# Patient Record
Sex: Female | Born: 1985 | Race: Black or African American | Hispanic: No | Smoking: Never smoker
Health system: Southern US, Community
[De-identification: ages and names within clinical notes are randomized; demographics above are authoritative.]

## PROBLEM LIST (undated history)

## (undated) DIAGNOSIS — M419 Scoliosis, unspecified: Secondary | ICD-10-CM

## (undated) DIAGNOSIS — G8929 Other chronic pain: Secondary | ICD-10-CM

## (undated) DIAGNOSIS — F32A Depression, unspecified: Secondary | ICD-10-CM

## (undated) DIAGNOSIS — F99 Mental disorder, not otherwise specified: Secondary | ICD-10-CM

## (undated) DIAGNOSIS — F419 Anxiety disorder, unspecified: Secondary | ICD-10-CM

## (undated) DIAGNOSIS — M549 Dorsalgia, unspecified: Secondary | ICD-10-CM

## (undated) DIAGNOSIS — F329 Major depressive disorder, single episode, unspecified: Secondary | ICD-10-CM

## (undated) HISTORY — DX: Depression, unspecified: F32.A

## (undated) HISTORY — DX: Major depressive disorder, single episode, unspecified: F32.9

## (undated) HISTORY — PX: NO PAST SURGERIES: SHX2092

---
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
## 2007-02-17 ENCOUNTER — Encounter: Payer: Self-pay | Admitting: *Deleted

## 2007-04-08 ENCOUNTER — Emergency Department (HOSPITAL_COMMUNITY): Admission: EM | Admit: 2007-04-08 | Discharge: 2007-04-08 | Payer: Self-pay | Admitting: Emergency Medicine

## 2007-11-22 ENCOUNTER — Emergency Department (HOSPITAL_COMMUNITY): Admission: EM | Admit: 2007-11-22 | Discharge: 2007-11-22 | Payer: Self-pay | Admitting: Family Medicine

## 2007-12-27 ENCOUNTER — Emergency Department (HOSPITAL_COMMUNITY): Admission: EM | Admit: 2007-12-27 | Discharge: 2007-12-28 | Payer: Self-pay | Admitting: Emergency Medicine

## 2008-05-05 ENCOUNTER — Emergency Department (HOSPITAL_COMMUNITY): Admission: EM | Admit: 2008-05-05 | Discharge: 2008-05-05 | Payer: Self-pay | Admitting: Emergency Medicine

## 2008-05-17 ENCOUNTER — Encounter: Admission: RE | Admit: 2008-05-17 | Discharge: 2008-06-21 | Payer: Self-pay | Admitting: Chiropractic Medicine

## 2008-07-04 ENCOUNTER — Emergency Department (HOSPITAL_COMMUNITY): Admission: EM | Admit: 2008-07-04 | Discharge: 2008-07-04 | Payer: Self-pay | Admitting: Emergency Medicine

## 2008-11-07 ENCOUNTER — Emergency Department (HOSPITAL_COMMUNITY): Admission: EM | Admit: 2008-11-07 | Discharge: 2008-11-08 | Payer: Self-pay | Admitting: Emergency Medicine

## 2008-12-04 ENCOUNTER — Emergency Department (HOSPITAL_COMMUNITY): Admission: EM | Admit: 2008-12-04 | Discharge: 2008-12-04 | Payer: Self-pay | Admitting: Emergency Medicine

## 2008-12-06 ENCOUNTER — Emergency Department (HOSPITAL_COMMUNITY): Admission: EM | Admit: 2008-12-06 | Discharge: 2008-12-06 | Payer: Self-pay | Admitting: Emergency Medicine

## 2009-01-07 ENCOUNTER — Emergency Department (HOSPITAL_COMMUNITY): Admission: EM | Admit: 2009-01-07 | Discharge: 2009-01-08 | Payer: Self-pay | Admitting: Emergency Medicine

## 2009-01-08 ENCOUNTER — Ambulatory Visit: Payer: Self-pay | Admitting: *Deleted

## 2009-01-08 ENCOUNTER — Inpatient Hospital Stay (HOSPITAL_COMMUNITY): Admission: AD | Admit: 2009-01-08 | Discharge: 2009-01-15 | Payer: Self-pay | Admitting: *Deleted

## 2009-03-08 ENCOUNTER — Emergency Department (HOSPITAL_COMMUNITY): Admission: EM | Admit: 2009-03-08 | Discharge: 2009-03-09 | Payer: Self-pay | Admitting: Emergency Medicine

## 2009-03-09 ENCOUNTER — Ambulatory Visit: Payer: Self-pay | Admitting: *Deleted

## 2009-03-09 ENCOUNTER — Inpatient Hospital Stay (HOSPITAL_COMMUNITY): Admission: RE | Admit: 2009-03-09 | Discharge: 2009-03-14 | Payer: Self-pay | Admitting: *Deleted

## 2009-06-11 ENCOUNTER — Emergency Department (HOSPITAL_COMMUNITY): Admission: EM | Admit: 2009-06-11 | Discharge: 2009-06-11 | Payer: Self-pay | Admitting: Emergency Medicine

## 2009-06-13 ENCOUNTER — Inpatient Hospital Stay (HOSPITAL_COMMUNITY): Admission: EM | Admit: 2009-06-13 | Discharge: 2009-06-17 | Payer: Self-pay | Admitting: Emergency Medicine

## 2009-08-14 ENCOUNTER — Emergency Department (HOSPITAL_COMMUNITY): Admission: EM | Admit: 2009-08-14 | Discharge: 2009-08-21 | Payer: Self-pay | Admitting: Emergency Medicine

## 2009-11-21 ENCOUNTER — Inpatient Hospital Stay (HOSPITAL_COMMUNITY): Admission: AD | Admit: 2009-11-21 | Discharge: 2009-11-21 | Payer: Self-pay | Admitting: Obstetrics & Gynecology

## 2010-10-01 LAB — WET PREP, GENITAL: Trich, Wet Prep: NONE SEEN

## 2010-10-01 LAB — HERPES SIMPLEX VIRUS CULTURE: Culture: NOT DETECTED

## 2010-10-01 LAB — GC/CHLAMYDIA PROBE AMP, GENITAL
Chlamydia, DNA Probe: NEGATIVE
GC Probe Amp, Genital: NEGATIVE

## 2010-10-02 LAB — RAPID URINE DRUG SCREEN, HOSP PERFORMED
Amphetamines: NOT DETECTED
Benzodiazepines: NOT DETECTED
Cocaine: NOT DETECTED

## 2010-10-02 LAB — URINALYSIS, ROUTINE W REFLEX MICROSCOPIC
Bilirubin Urine: NEGATIVE
Glucose, UA: NEGATIVE mg/dL
Hgb urine dipstick: NEGATIVE
Ketones, ur: NEGATIVE mg/dL
Nitrite: NEGATIVE
Protein, ur: NEGATIVE mg/dL
pH: 6 (ref 5.0–8.0)

## 2010-10-02 LAB — COMPREHENSIVE METABOLIC PANEL
ALT: 14 U/L (ref 0–35)
AST: 17 U/L (ref 0–37)
Albumin: 3.9 g/dL (ref 3.5–5.2)
Alkaline Phosphatase: 27 U/L — ABNORMAL LOW (ref 39–117)
CO2: 27 mEq/L (ref 19–32)
Chloride: 105 mEq/L (ref 96–112)
Creatinine, Ser: 0.66 mg/dL (ref 0.4–1.2)
GFR calc Af Amer: >60 mL/min (ref >60)
Glucose, Bld: 95 mg/dL (ref 70–99)
Total Bilirubin: 0.6 mg/dL (ref 0.3–1.2)

## 2010-10-02 LAB — CBC
Platelets: 201 10*3/uL (ref 150–400)
WBC: 6.4 10*3/uL (ref 4.0–10.5)

## 2010-10-02 LAB — DIFFERENTIAL
Basophils Absolute: 0 10*3/uL (ref 0.0–0.1)
Eosinophils Absolute: 0.1 10*3/uL (ref 0.0–0.7)
Eosinophils Relative: 1 % (ref 0–5)
Lymphocytes Relative: 52 % — ABNORMAL HIGH (ref 12–46)
Monocytes Absolute: 0.3 10*3/uL (ref 0.1–1.0)
Monocytes Relative: 4 % (ref 3–12)
Neutro Abs: 2.7 10*3/uL (ref 1.7–7.7)

## 2010-10-02 LAB — ETHANOL: Alcohol, Ethyl (B): 6 mg/dL (ref 0–10)

## 2010-10-15 LAB — URINE MICROSCOPIC-ADD ON

## 2010-10-15 LAB — COMPREHENSIVE METABOLIC PANEL
Albumin: 3 g/dL — ABNORMAL LOW (ref 3.5–5.2)
Albumin: 3.6 g/dL (ref 3.5–5.2)
Alkaline Phosphatase: 23 U/L — ABNORMAL LOW (ref 39–117)
BUN: 1 mg/dL — ABNORMAL LOW (ref 6–23)
CO2: 27 mEq/L (ref 19–32)
Calcium: 8.1 mg/dL — ABNORMAL LOW (ref 8.4–10.5)
Calcium: 8.5 mg/dL (ref 8.4–10.5)
Chloride: 106 mEq/L (ref 96–112)
Creatinine, Ser: 0.68 mg/dL (ref 0.4–1.2)
Creatinine, Ser: 0.68 mg/dL (ref 0.4–1.2)
GFR calc Af Amer: >60 mL/min (ref >60)
Glucose, Bld: 116 mg/dL — ABNORMAL HIGH (ref 70–99)
Potassium: 3.6 mEq/L (ref 3.5–5.1)
Sodium: 139 mEq/L (ref 135–145)
Total Protein: 5.2 g/dL — ABNORMAL LOW (ref 6.0–8.3)
Total Protein: 6.2 g/dL (ref 6.0–8.3)

## 2010-10-15 LAB — URINALYSIS, ROUTINE W REFLEX MICROSCOPIC
Ketones, ur: NEGATIVE mg/dL
Nitrite: NEGATIVE
Protein, ur: NEGATIVE mg/dL

## 2010-10-15 LAB — DIFFERENTIAL
Basophils Absolute: 0 10*3/uL (ref 0.0–0.1)
Basophils Relative: 0 % (ref 0–1)
Eosinophils Absolute: 0 10*3/uL (ref 0.0–0.7)
Lymphocytes Relative: 50 % — ABNORMAL HIGH (ref 12–46)
Monocytes Absolute: 0.3 10*3/uL (ref 0.1–1.0)
Neutrophils Relative %: 45 % (ref 43–77)

## 2010-10-15 LAB — CBC
HCT: 28.7 % — ABNORMAL LOW (ref 36.0–46.0)
HCT: 31.8 % — ABNORMAL LOW (ref 36.0–46.0)
MCV: 79.6 fL (ref 78.0–100.0)
Platelets: 197 10*3/uL (ref 150–400)
Platelets: 224 10*3/uL (ref 150–400)
RDW: 16.2 % — ABNORMAL HIGH (ref 11.5–15.5)

## 2010-10-15 LAB — URINE CULTURE
Colony Count: NO GROWTH
Culture: NO GROWTH

## 2010-10-15 LAB — POCT PREGNANCY, URINE: Preg Test, Ur: NEGATIVE

## 2010-10-16 LAB — URINALYSIS, ROUTINE W REFLEX MICROSCOPIC
Ketones, ur: NEGATIVE mg/dL
Nitrite: NEGATIVE
Protein, ur: NEGATIVE mg/dL
pH: 7.5 (ref 5.0–8.0)

## 2010-10-16 LAB — POCT PREGNANCY, URINE: Preg Test, Ur: NEGATIVE

## 2010-10-16 LAB — DIFFERENTIAL
Basophils Relative: 0 % (ref 0–1)
Eosinophils Absolute: 0 10*3/uL (ref 0.0–0.7)
Lymphs Abs: 3.4 10*3/uL (ref 0.7–4.0)
Neutrophils Relative %: 38 % — ABNORMAL LOW (ref 43–77)

## 2010-10-16 LAB — COMPREHENSIVE METABOLIC PANEL
ALT: 11 U/L (ref 0–35)
CO2: 27 mEq/L (ref 19–32)
Calcium: 7.9 mg/dL — ABNORMAL LOW (ref 8.4–10.5)
Creatinine, Ser: 0.8 mg/dL (ref 0.4–1.2)
GFR calc non Af Amer: >60 mL/min (ref >60)
Glucose, Bld: 78 mg/dL (ref 70–99)
Sodium: 139 mEq/L (ref 135–145)

## 2010-10-16 LAB — CBC
HCT: 28.2 % — ABNORMAL LOW (ref 36.0–46.0)
Hemoglobin: 9.6 g/dL — ABNORMAL LOW (ref 12.0–15.0)
MCHC: 34.1 g/dL (ref 30.0–36.0)
MCV: 79.4 fL (ref 78.0–100.0)
RBC: 3.56 MIL/uL — ABNORMAL LOW (ref 3.87–5.11)

## 2010-10-19 LAB — BASIC METABOLIC PANEL
CO2: 26 mEq/L (ref 19–32)
Calcium: 9 mg/dL (ref 8.4–10.5)
Chloride: 104 mEq/L (ref 96–112)
GFR calc Af Amer: >60 mL/min (ref >60)
Sodium: 138 mEq/L (ref 135–145)

## 2010-10-19 LAB — URINE MICROSCOPIC-ADD ON

## 2010-10-19 LAB — DIFFERENTIAL
Basophils Relative: 1 % (ref 0–1)
Lymphs Abs: 1.8 10*3/uL (ref 0.7–4.0)
Monocytes Absolute: 0.2 10*3/uL (ref 0.1–1.0)
Monocytes Relative: 4 % (ref 3–12)
Neutro Abs: 3.9 10*3/uL (ref 1.7–7.7)

## 2010-10-19 LAB — CBC
Hemoglobin: 11.2 g/dL — ABNORMAL LOW (ref 12.0–15.0)
MCHC: 33.4 g/dL (ref 30.0–36.0)
MCV: 78.2 fL (ref 78.0–100.0)
RBC: 4.29 MIL/uL (ref 3.87–5.11)
WBC: 5.9 10*3/uL (ref 4.0–10.5)

## 2010-10-19 LAB — URINALYSIS, ROUTINE W REFLEX MICROSCOPIC
Bilirubin Urine: NEGATIVE
Glucose, UA: NEGATIVE mg/dL
Hgb urine dipstick: NEGATIVE
Nitrite: NEGATIVE
Protein, ur: NEGATIVE mg/dL
Specific Gravity, Urine: 1.02 (ref 1.005–1.030)
Specific Gravity, Urine: 1.029 (ref 1.005–1.030)
Urobilinogen, UA: 0.2 mg/dL (ref 0.0–1.0)
pH: 6 (ref 5.0–8.0)

## 2010-10-19 LAB — GC/CHLAMYDIA PROBE AMP, URINE: Chlamydia, Swab/Urine, PCR: NEGATIVE

## 2010-10-19 LAB — RAPID URINE DRUG SCREEN, HOSP PERFORMED: Barbiturates: NOT DETECTED

## 2010-10-21 LAB — URINE MICROSCOPIC-ADD ON

## 2010-10-21 LAB — DIFFERENTIAL
Basophils Absolute: 0 10*3/uL (ref 0.0–0.1)
Lymphocytes Relative: 33 % (ref 12–46)
Lymphs Abs: 2.5 10*3/uL (ref 0.7–4.0)
Monocytes Absolute: 0.4 10*3/uL (ref 0.1–1.0)
Neutro Abs: 4.6 10*3/uL (ref 1.7–7.7)

## 2010-10-21 LAB — URINALYSIS, ROUTINE W REFLEX MICROSCOPIC
Glucose, UA: NEGATIVE mg/dL
Nitrite: NEGATIVE
Specific Gravity, Urine: 1.026 (ref 1.005–1.030)
pH: 5.5 (ref 5.0–8.0)

## 2010-10-21 LAB — CBC
HCT: 33.6 % — ABNORMAL LOW (ref 36.0–46.0)
Hemoglobin: 11.1 g/dL — ABNORMAL LOW (ref 12.0–15.0)
RDW: 15.6 % — ABNORMAL HIGH (ref 11.5–15.5)
WBC: 7.6 10*3/uL (ref 4.0–10.5)

## 2010-10-21 LAB — BASIC METABOLIC PANEL
Calcium: 9.2 mg/dL (ref 8.4–10.5)
GFR calc Af Amer: >60 mL/min (ref >60)
GFR calc non Af Amer: >60 mL/min (ref >60)
Glucose, Bld: 97 mg/dL (ref 70–99)
Potassium: 3.5 mEq/L (ref 3.5–5.1)
Sodium: 138 mEq/L (ref 135–145)

## 2010-10-21 LAB — ACETAMINOPHEN LEVEL: Acetaminophen (Tylenol), Serum: <10 ug/mL — ABNORMAL LOW (ref 10–30)

## 2010-10-21 LAB — ETHANOL: Alcohol, Ethyl (B): <5 mg/dL (ref 0–10)

## 2010-10-22 LAB — URINE MICROSCOPIC-ADD ON

## 2010-10-22 LAB — URINALYSIS, ROUTINE W REFLEX MICROSCOPIC
Bilirubin Urine: NEGATIVE
Glucose, UA: NEGATIVE mg/dL
Hgb urine dipstick: NEGATIVE
Ketones, ur: NEGATIVE mg/dL
Nitrite: NEGATIVE
Specific Gravity, Urine: 1.03 (ref 1.005–1.030)
pH: 6 (ref 5.0–8.0)
pH: 6 (ref 5.0–8.0)

## 2010-10-22 LAB — CBC
HCT: 33.6 % — ABNORMAL LOW (ref 36.0–46.0)
MCHC: 33.1 g/dL (ref 30.0–36.0)
MCV: 77.2 fL — ABNORMAL LOW (ref 78.0–100.0)
MCV: 77.6 fL — ABNORMAL LOW (ref 78.0–100.0)
Platelets: 255 10*3/uL (ref 150–400)
RBC: 4.42 MIL/uL (ref 3.87–5.11)
RDW: 15.6 % — ABNORMAL HIGH (ref 11.5–15.5)

## 2010-10-22 LAB — WET PREP, GENITAL: Trich, Wet Prep: NONE SEEN

## 2010-10-22 LAB — DIFFERENTIAL
Basophils Absolute: 0 10*3/uL (ref 0.0–0.1)
Basophils Relative: 0 % (ref 0–1)
Eosinophils Absolute: 0 10*3/uL (ref 0.0–0.7)
Eosinophils Relative: 1 % (ref 0–5)
Lymphocytes Relative: 36 % (ref 12–46)
Lymphs Abs: 2.4 10*3/uL (ref 0.7–4.0)
Neutro Abs: 3.7 10*3/uL (ref 1.7–7.7)
Neutrophils Relative %: 55 % (ref 43–77)

## 2010-10-22 LAB — COMPREHENSIVE METABOLIC PANEL
AST: 20 U/L (ref 0–37)
CO2: 26 mEq/L (ref 19–32)
Calcium: 8.8 mg/dL (ref 8.4–10.5)
Creatinine, Ser: 0.75 mg/dL (ref 0.4–1.2)
GFR calc Af Amer: >60 mL/min (ref >60)
GFR calc non Af Amer: >60 mL/min (ref >60)
Glucose, Bld: 71 mg/dL (ref 70–99)

## 2010-10-22 LAB — GC/CHLAMYDIA PROBE AMP, GENITAL: GC Probe Amp, Genital: NEGATIVE

## 2010-10-22 LAB — LIPASE, BLOOD: Lipase: 26 U/L (ref 11–59)
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
## 2011-01-28 ENCOUNTER — Inpatient Hospital Stay (INDEPENDENT_AMBULATORY_CARE_PROVIDER_SITE_OTHER)
Admission: RE | Admit: 2011-01-28 | Discharge: 2011-01-28 | Disposition: A | Discharge status: Home / Self Care | Payer: Medicaid Other | Source: Ambulatory Visit | Attending: Family Medicine | Admitting: Family Medicine

## 2011-01-28 DIAGNOSIS — L259 Unspecified contact dermatitis, unspecified cause: Secondary | ICD-10-CM

## 2011-04-10 LAB — DIFFERENTIAL
Eosinophils Absolute: 0
Eosinophils Relative: 0
Lymphs Abs: 2.8
Monocytes Absolute: 0.3
Monocytes Relative: 4

## 2011-04-10 LAB — URINALYSIS, ROUTINE W REFLEX MICROSCOPIC
Bilirubin Urine: NEGATIVE
Glucose, UA: NEGATIVE
Hgb urine dipstick: NEGATIVE
Protein, ur: NEGATIVE
Urobilinogen, UA: 1

## 2011-04-10 LAB — CBC
HCT: 34.8 — ABNORMAL LOW
Hemoglobin: 11.6 — ABNORMAL LOW
MCHC: 33.3
MCV: 76.9 — ABNORMAL LOW
Platelets: 215
RBC: 4.52
RDW: 15.2
WBC: 6.8

## 2011-04-10 LAB — POCT I-STAT, CHEM 8
BUN: 12
Calcium, Ion: 1.23
Chloride: 105
Creatinine, Ser: 0.9
Glucose, Bld: 89
HCT: 37
Hemoglobin: 12.6
Potassium: 4.1
Sodium: 138
TCO2: 25

## 2011-04-10 LAB — URINE MICROSCOPIC-ADD ON

## 2011-04-10 LAB — POCT PREGNANCY, URINE
Operator id: 29011
Preg Test, Ur: NEGATIVE

## 2011-04-18 LAB — URINALYSIS, ROUTINE W REFLEX MICROSCOPIC
Bilirubin Urine: NEGATIVE
Glucose, UA: NEGATIVE mg/dL
Hgb urine dipstick: NEGATIVE
Ketones, ur: NEGATIVE mg/dL
pH: 8 (ref 5.0–8.0)

## 2011-04-18 LAB — COMPREHENSIVE METABOLIC PANEL
AST: 21 U/L (ref 0–37)
Albumin: 3.7 g/dL (ref 3.5–5.2)
Alkaline Phosphatase: 30 U/L — ABNORMAL LOW (ref 39–117)
BUN: 7 mg/dL (ref 6–23)
Chloride: 106 mEq/L (ref 96–112)
Potassium: 4 mEq/L (ref 3.5–5.1)
Total Bilirubin: 0.6 mg/dL (ref 0.3–1.2)

## 2011-04-18 LAB — CBC
HCT: 35.9 % — ABNORMAL LOW (ref 36.0–46.0)
Platelets: 259 10*3/uL (ref 150–400)
RBC: 4.54 MIL/uL (ref 3.87–5.11)
WBC: 5.4 10*3/uL (ref 4.0–10.5)

## 2011-04-18 LAB — URINE MICROSCOPIC-ADD ON

## 2011-04-18 LAB — DIFFERENTIAL
Basophils Absolute: 0 10*3/uL (ref 0.0–0.1)
Basophils Relative: 1 % (ref 0–1)
Eosinophils Relative: 0 % (ref 0–5)
Monocytes Absolute: 0.3 10*3/uL (ref 0.1–1.0)
Neutro Abs: 3 10*3/uL (ref 1.7–7.7)

## 2011-05-01 ENCOUNTER — Emergency Department (HOSPITAL_COMMUNITY)
Admission: EM | Admit: 2011-05-01 | Discharge: 2011-05-02 | Disposition: A | Discharge status: Home / Self Care | Payer: Medicaid Other | Source: Home / Self Care | Attending: Emergency Medicine | Admitting: Emergency Medicine

## 2011-05-01 DIAGNOSIS — F319 Bipolar disorder, unspecified: Secondary | ICD-10-CM | POA: Insufficient documentation

## 2011-05-01 DIAGNOSIS — R1032 Left lower quadrant pain: Secondary | ICD-10-CM | POA: Insufficient documentation

## 2011-05-01 LAB — POCT PREGNANCY, URINE: Preg Test, Ur: NEGATIVE

## 2011-05-02 ENCOUNTER — Inpatient Hospital Stay (HOSPITAL_COMMUNITY)
Admission: AD | Admit: 2011-05-02 | Discharge: 2011-05-02 | Disposition: A | Discharge status: Home / Self Care | Payer: Medicaid Other | Source: Ambulatory Visit | Attending: Obstetrics & Gynecology | Admitting: Obstetrics & Gynecology

## 2011-05-02 ENCOUNTER — Inpatient Hospital Stay (HOSPITAL_COMMUNITY): Payer: Medicaid Other

## 2011-05-02 ENCOUNTER — Encounter (HOSPITAL_COMMUNITY): Payer: Self-pay

## 2011-05-02 DIAGNOSIS — N83209 Unspecified ovarian cyst, unspecified side: Secondary | ICD-10-CM

## 2011-05-02 HISTORY — DX: Mental disorder, not otherwise specified: F99

## 2011-05-02 HISTORY — DX: Anxiety disorder, unspecified: F41.9

## 2011-05-02 LAB — URINALYSIS, ROUTINE W REFLEX MICROSCOPIC
Glucose, UA: NEGATIVE mg/dL
Leukocytes, UA: NEGATIVE
Leukocytes, UA: NEGATIVE
Nitrite: NEGATIVE
Specific Gravity, Urine: 1.03 (ref 1.005–1.030)
Specific Gravity, Urine: >1.03 — ABNORMAL HIGH (ref 1.005–1.030)
pH: 6 (ref 5.0–8.0)
pH: 6.5 (ref 5.0–8.0)

## 2011-05-02 MED ORDER — KETOROLAC TROMETHAMINE 60 MG/2ML IM SOLN
60.0000 mg | Freq: Once | INTRAMUSCULAR | Status: AC
  Administered 2011-05-02: 60 mg via INTRAMUSCULAR
  Filled 2011-05-02: qty 2

## 2011-05-02 MED ORDER — OXYCODONE-ACETAMINOPHEN 5-325 MG PO TABS: 1.0000 | ORAL_TABLET | ORAL | Status: AC | PRN

## 2011-05-02 MED ORDER — OXYCODONE-ACETAMINOPHEN 5-325 MG PO TABS
1.0000 | ORAL_TABLET | Freq: Once | ORAL | Status: AC
  Administered 2011-05-02: 1 via ORAL
  Filled 2011-05-02: qty 1

## 2011-05-02 NOTE — ED Provider Notes (Signed)
History     Chief Complaint  Patient presents with  . Abdominal Pain   HPI Pt c/o left sided low abd pain x 3-4 weeks, worsening, was intermittent, now constant. No vaginal bleeding or discharge. Evaluated at [facility] last night, pelvic exam with wet prep and cultures at that time, was told to come here if pain continued.   OB History    Grav Para Term Preterm Abortions TAB SAB Ect Mult Living   0 0 0 0 0 0 0 0 0 0       Past Medical History  Diagnosis Date  . Anxiety   . Mental disorder     bipolar, on meds    Past Surgical History  Procedure Date  . No past surgeries     No family history on file.  History  Substance Use Topics  . Smoking status: Never Smoker   . Smokeless tobacco: Not on file  . Alcohol Use: No    Allergies: No Known Allergies  Prescriptions prior to admission  Medication Sig Dispense Refill  . hydrOXYzine (VISTARIL) 25 MG capsule Take 25 mg by mouth 3 (three) times daily as needed. Patient takes for anxiety       . PARoxetine (PAXIL) 20 MG tablet Take 30 mg by mouth daily.          Review of Systems  Constitutional: Negative.   Respiratory: Negative.   Cardiovascular: Negative.   Gastrointestinal: Positive for abdominal pain. Negative for nausea, vomiting, diarrhea and constipation.  Genitourinary: Negative for dysuria, urgency, frequency, hematuria and flank pain.       Negative for vaginal bleeding and discharge   Musculoskeletal: Negative.   Neurological: Negative.   Psychiatric/Behavioral: Negative.    Physical Exam   Blood pressure 119/79, pulse 64, temperature 98.5 F (36.9 C), temperature source Oral, resp. rate 16, height 5\' 1"  (1.549 m), weight 41.459 kg (91 lb 6.4 oz), last menstrual period 04/21/2011, SpO2 99.00%.  Physical Exam  Constitutional: She is oriented to person, place, and time. She appears well-developed and well-nourished. No distress.  Cardiovascular: Normal rate.   Respiratory: Effort normal.  GI: Soft. There  is tenderness (LLQ).  Musculoskeletal: Normal range of motion.  Neurological: She is alert and oriented to person, place, and time.  Skin: Skin is warm and dry.  Psychiatric: She has a normal mood and affect.    MAU Course  Procedures  Results for orders placed during the hospital encounter of 05/02/11 (from the past 48 hour(s))  URINALYSIS, ROUTINE W REFLEX MICROSCOPIC     Status: Abnormal   Collection Time   05/02/11  6:57 PM      Component Value Range Comment   Color, Urine YELLOW  YELLOW     Appearance HAZY (*) CLEAR     Specific Gravity, Urine >1.030 (*) 1.005 - 1.030     pH 6.0  5.0 - 8.0     Glucose, UA NEGATIVE  NEGATIVE (mg/dL)    Hgb urine dipstick NEGATIVE  NEGATIVE     Bilirubin Urine NEGATIVE  NEGATIVE     Ketones, ur NEGATIVE  NEGATIVE (mg/dL)    Protein, ur NEGATIVE  NEGATIVE (mg/dL)    Urobilinogen, UA 1.0  0.0 - 1.0 (mg/dL)    Nitrite NEGATIVE  NEGATIVE     Leukocytes, UA NEGATIVE  NEGATIVE  MICROSCOPIC NOT DONE ON URINES WITH NEGATIVE PROTEIN, BLOOD, LEUKOCYTES, NITRITE, OR GLUCOSE <1000 mg/dL.  POCT PREGNANCY, URINE     Status: Normal   Collection Time  05/02/11  7:01 PM      Component Value Range Comment   Preg Test, Ur NEGATIVE       Assessment and Plan  25 y.o. female with LLQ pain U/S Pending Care assumed by M. Maris Berger   [facility] 05/02/2011, 8:45 PM

## 2011-05-02 NOTE — Progress Notes (Signed)
Pt states she has been having pain on her left side for about 3-4 weeks, getting worse. Went to [facility] ED last night and was told she might have an ovarian cyst and to come to [facility]. Pt denies any bleeding or discharge.

## 2011-05-02 NOTE — ED Provider Notes (Signed)
History     Chief Complaint  Patient presents with  . Abdominal Pain   HPI  Care assumed from [employer] CNM. Waiting on [place] result.  Past Medical History  Diagnosis Date  . Anxiety   . Mental disorder     bipolar, on meds    Past Surgical History  Procedure Date  . No past surgeries     No family history on file.  History  Substance Use Topics  . Smoking status: Never Smoker   . Smokeless tobacco: Not on file  . Alcohol Use: No    Allergies: No Known Allergies  Prescriptions prior to admission  Medication Sig Dispense Refill  . hydrOXYzine (VISTARIL) 25 MG capsule Take 25 mg by mouth 3 (three) times daily as needed. Patient takes for anxiety       . PARoxetine (PAXIL) 20 MG tablet Take 30 mg by mouth daily.          ROS Physical Exam   Blood pressure 119/79, pulse 64, temperature 98.5 F (36.9 C), temperature source Oral, resp. rate 16, height 5\' 1"  (1.549 m), weight 91 lb 6.4 oz (41.459 kg), last menstrual period 04/21/2011, SpO2 99.00%.  Physical Exam See prior note [place]:  US Transvaginal Non-ob US Pelvis Complete  05/02/2011  *RADIOLOGY REPORT*  Clinical Data: Left lower quadrant pelvic pain.  TRANSABDOMINAL AND TRANSVAGINAL ULTRASOUND OF PELVIS Technique:  Both transabdominal and transvaginal ultrasound examinations of the pelvis were performed. Transabdominal technique was performed for global imaging of the pelvis including uterus, ovaries, adnexal regions, and pelvic cul-de-sac.  Comparison: CT scan of 06/14/2009   It was necessary to proceed with endovaginal exam following the transabdominal exam to visualize the ovaries.  Findings:  Uterus: Measures 6.6 x 3.3 x 4.2 cm, with normal myometrial appearance.  Endometrium: Measures 1 mm in thickness and appears normal.  Right ovary:  Measures 3.7 x 2.3 x 2.2 cm and appears normal.  Left ovary: Measures 3.2 x 2.8 x 2.4 cm and appears normal.  Other findings: A small Nabothian cyst is noted.  There is a small to  moderate amount of free pelvic fluid, abnormal but of uncertain etiology.  IMPRESSION:  1.  Normal appearance of the uterus and ovaries.  However, there is a small to moderate amount of free pelvic fluid which is abnormal but of uncertain etiology.  Original Report Authenticated By: Dellia Cloud, M.D.   MAU Course  Procedures  Assessment and Plan  A:  Probably ruptured ovarian cyst P:  Discussed with Dr Penne Lash Will d/c home with precautions, if develops other symptoms or worsens, come back   [facility] 05/02/2011, 9:20 PM

## 2011-05-03 LAB — GC/CHLAMYDIA PROBE AMP, GENITAL
Chlamydia, DNA Probe: NEGATIVE
GC Probe Amp, Genital: NEGATIVE

## 2011-05-03 NOTE — ED Provider Notes (Signed)
Agree with above note.  Quenisha Lovins H. 05/03/2011 5:03 AM

## 2011-05-03 NOTE — ED Provider Notes (Signed)
Agree with above note.  Adir Schicker H. 05/03/2011 5:04 AM  

## 2012-01-26 ENCOUNTER — Encounter (HOSPITAL_COMMUNITY): Payer: Self-pay | Admitting: *Deleted

## 2012-01-26 ENCOUNTER — Emergency Department (HOSPITAL_COMMUNITY)
Admission: EM | Admit: 2012-01-26 | Discharge: 2012-01-26 | Disposition: A | Discharge status: Home / Self Care | Payer: Medicaid Other | Attending: Emergency Medicine | Admitting: Emergency Medicine

## 2012-01-26 ENCOUNTER — Other Ambulatory Visit: Payer: Self-pay | Admitting: Emergency Medicine

## 2012-01-26 DIAGNOSIS — N63 Unspecified lump in unspecified breast: Secondary | ICD-10-CM

## 2012-01-26 DIAGNOSIS — F411 Generalized anxiety disorder: Secondary | ICD-10-CM | POA: Insufficient documentation

## 2012-01-26 DIAGNOSIS — N6310 Unspecified lump in the right breast, unspecified quadrant: Secondary | ICD-10-CM

## 2012-01-26 DIAGNOSIS — F319 Bipolar disorder, unspecified: Secondary | ICD-10-CM | POA: Insufficient documentation

## 2012-01-26 NOTE — ED Notes (Signed)
To ED for eval of 'lump in right breast' discovered yesterday. Painful.

## 2012-01-26 NOTE — ED Notes (Signed)
NAD noted at time of d/c home. Pt verbalized understanding of d/c inst. 

## 2012-01-26 NOTE — ED Provider Notes (Signed)
Medical screening examination/treatment/procedure(s) were performed by non-physician practitioner and as supervising physician I was immediately available for consultation/collaboration.   Dione Booze, MD 01/26/12 [id number]

## 2012-01-26 NOTE — ED Provider Notes (Signed)
History     CSN: [id number]  Arrival date & time 01/26/12  0920   First MD Initiated Contact with Patient 01/26/12 [phone]      Chief Complaint  Patient presents with  . Breast Pain    (Consider location/radiation/quality/duration/timing/severity/associated sxs/prior treatment) HPI Comments: Pt is a 26yo female who presents with complaint of a lump in her right breast. States noted it yesterday. States 'lump" is tender, but not painful. Denies hx of the same. No skin chagnes. No redness, swelling. No injury to the breast. Not pregnant. No nipple drainage. No PCP.   The history is provided by the patient.    Past Medical History  Diagnosis Date  . Anxiety   . Mental disorder     bipolar, on meds    Past Surgical History  Procedure Date  . No past surgeries     History reviewed. No pertinent family history.  History  Substance Use Topics  . Smoking status: Never Smoker   . Smokeless tobacco: Not on file  . Alcohol Use: No    OB History    Grav Para Term Preterm Abortions TAB SAB Ect Mult Living   0 0 0 0 0 0 0 0 0 0       Review of Systems  Constitutional: Negative for fever and chills.  Respiratory: Negative.   Cardiovascular: Negative for chest pain, palpitations and leg swelling.       Positive for chest tenderness  Musculoskeletal: Negative.   Skin: Negative for color change and rash.  Neurological: Negative for dizziness and weakness.    Allergies  Review of patient's allergies indicates no known allergies.  Home Medications  No current outpatient prescriptions on file.  There were no vitals taken for this visit.  Physical Exam  Nursing note and vitals reviewed. Constitutional: She is oriented to person, place, and time. She appears well-developed and well-nourished. No distress.  Cardiovascular: Normal rate and normal heart sounds.   Pulmonary/Chest: Effort normal and breath sounds normal. No respiratory distress. She has no wheezes. She has no  rales.       There is about 2x2 cm soft mobile dense tissue in the right upper breast tissue, around 11o'clock. No right axillary lymphadenopathy. Rest of the bresst appears normal.   Neurological: She is oriented to person, place, and time.  Skin: Skin is warm and dry.  Psychiatric: She has a normal mood and affect.    ED Course  Procedures (including critical care time)  Density noted in the right breast. No skin changes, mild tenderness. No definite abscess. Will need further evaluation. Will d/c to breast center.   1. Breast mass       MDM          Lottie Mussel, PA 01/26/12 1557

## 2012-01-28 ENCOUNTER — Ambulatory Visit
Admission: RE | Admit: 2012-01-28 | Discharge: 2012-01-28 | Disposition: A | Discharge status: Home / Self Care | Payer: Medicaid Other | Source: Ambulatory Visit | Attending: Emergency Medicine | Admitting: Emergency Medicine

## 2012-01-28 DIAGNOSIS — N6310 Unspecified lump in the right breast, unspecified quadrant: Secondary | ICD-10-CM

## 2012-03-26 ENCOUNTER — Emergency Department (HOSPITAL_COMMUNITY)
Admission: EM | Admit: 2012-03-26 | Discharge: 2012-03-26 | Disposition: A | Discharge status: Home / Self Care | Payer: Medicaid Other | Attending: Emergency Medicine | Admitting: Emergency Medicine

## 2012-03-26 ENCOUNTER — Encounter (HOSPITAL_COMMUNITY): Payer: Self-pay | Admitting: Emergency Medicine

## 2012-03-26 DIAGNOSIS — G43909 Migraine, unspecified, not intractable, without status migrainosus: Secondary | ICD-10-CM | POA: Insufficient documentation

## 2012-03-26 DIAGNOSIS — F319 Bipolar disorder, unspecified: Secondary | ICD-10-CM | POA: Insufficient documentation

## 2012-03-26 MED ORDER — DEXAMETHASONE SODIUM PHOSPHATE 10 MG/ML IJ SOLN
10.0000 mg | Freq: Once | INTRAMUSCULAR | Status: AC
  Administered 2012-03-26: 10 mg via INTRAVENOUS
  Filled 2012-03-26: qty 1

## 2012-03-26 MED ORDER — METOCLOPRAMIDE HCL 5 MG/ML IJ SOLN
10.0000 mg | Freq: Once | INTRAMUSCULAR | Status: AC
  Administered 2012-03-26: 10 mg via INTRAVENOUS
  Filled 2012-03-26: qty 2

## 2012-03-26 MED ORDER — DIPHENHYDRAMINE HCL 50 MG/ML IJ SOLN
25.0000 mg | Freq: Once | INTRAMUSCULAR | Status: AC
  Administered 2012-03-26: 25 mg via INTRAVENOUS
  Filled 2012-03-26: qty 1

## 2012-03-26 MED ORDER — KETOROLAC TROMETHAMINE 30 MG/ML IJ SOLN
30.0000 mg | Freq: Once | INTRAMUSCULAR | Status: AC
  Administered 2012-03-26: 30 mg via INTRAVENOUS
  Filled 2012-03-26: qty 1

## 2012-03-26 NOTE — ED Provider Notes (Signed)
History     CSN: [id number]  Arrival date & time 03/26/12  1414   First MD Initiated Contact with Patient 03/26/12 1647      Chief Complaint  Patient presents with  . Migraine    (Consider location/radiation/quality/duration/timing/severity/associated sxs/prior treatment) HPI Comments: Etta Grandchild 26 y.o. female   The chief complaint is: Patient presents with:   Migraine   The patient has medical history significant for:   Past Medical History:   Anxiety                                                      Mental disorder                                                Comment:bipolar, on meds  Patient presents with migraine that she states is posterior with radiation to the front, she rates it a 10/10 with photophobia and dizziness. Patient states that this is similar to other headaches, however this one does not respond to ibuprofen. Denies fever or chills. Denies cough, congestion, or sinus pressure. Denies NVD or abdominal pain. Denies neck stiffness. Denies that this is the worst headache of her life.      The history is provided by the patient.    Past Medical History  Diagnosis Date  . Anxiety   . Mental disorder     bipolar, on meds    Past Surgical History  Procedure Date  . No past surgeries     No family history on file.  History  Substance Use Topics  . Smoking status: Never Smoker   . Smokeless tobacco: Not on file  . Alcohol Use: No    OB History    Grav Para Term Preterm Abortions TAB SAB Ect Mult Living   0 0 0 0 0 0 0 0 0 0       Review of Systems  Constitutional: Negative for fever and chills.  HENT: Negative for congestion, neck pain and sinus pressure.   Eyes: Positive for photophobia.  Respiratory: Negative for cough.   Gastrointestinal: Negative for nausea, vomiting, abdominal pain and diarrhea.  Neurological: Positive for dizziness and headaches.  All other systems reviewed and are negative.    Allergies  Review of  patient's allergies indicates no known allergies.  Home Medications  No current outpatient prescriptions on file.  BP 107/57  Pulse 89  Temp 98.6 F (37 C) (Oral)  Resp 15  SpO2 100%  Physical Exam  Nursing note reviewed. Constitutional: She appears well-developed and well-nourished. No distress.  HENT:  Head: Normocephalic and atraumatic.  Mouth/Throat: Oropharynx is clear and moist.  Eyes: Conjunctivae normal and EOM are normal. Pupils are equal, round, and reactive to light. No scleral icterus.  Neck: Normal range of motion. Neck supple.       No meningeal signs.   Cardiovascular: Normal rate, regular rhythm and normal heart sounds.   Pulmonary/Chest: Effort normal and breath sounds normal.  Abdominal: Soft. Bowel sounds are normal. There is no tenderness.  Neurological: She is alert. No cranial nerve deficit. She exhibits normal muscle tone. Coordination normal.       Cranial nerves II-XII intact. No pronator  drift, negative romberg, good finger to nose.  Good strength with no sensory deficits.  Skin: Skin is warm and dry.    ED Course  Procedures (including critical care time)  Labs Reviewed - No data to display No results found.   1. Migraine       MDM  Patient presented with migraine. Migraine cocktail given with improvement. Patient is agreeable to discharge. Return precautions given. No red flags for subarachnoid or meningitis.         Pixie Casino, PA-C 03/26/12 [id number]

## 2012-03-26 NOTE — ED Notes (Signed)
Patient reports migraine since yesterday with dizziness and sensitivity to light.  Patient denies fevers or N/V

## 2012-03-26 NOTE — ED Notes (Signed)
NWG:[id number]> Expected date:03/26/12<BR> Expected time:<BR> Means of arrival:<BR> Comments:<BR> [facility]

## 2012-03-27 NOTE — ED Provider Notes (Signed)
Medical screening examination/treatment/procedure(s) were performed by non-physician practitioner and as supervising physician I was immediately available for consultation/collaboration.   Gwyneth Sprout, MD 03/27/12 0006

## 2012-09-24 ENCOUNTER — Encounter (HOSPITAL_COMMUNITY): Payer: Self-pay | Admitting: Nurse Practitioner

## 2012-09-24 ENCOUNTER — Emergency Department (HOSPITAL_COMMUNITY)
Admission: EM | Admit: 2012-09-24 | Discharge: 2012-09-24 | Disposition: A | Discharge status: Home / Self Care | Payer: Medicaid Other | Attending: Emergency Medicine | Admitting: Emergency Medicine

## 2012-09-24 DIAGNOSIS — R1084 Generalized abdominal pain: Secondary | ICD-10-CM | POA: Insufficient documentation

## 2012-09-24 DIAGNOSIS — R197 Diarrhea, unspecified: Secondary | ICD-10-CM | POA: Insufficient documentation

## 2012-09-24 DIAGNOSIS — Z8659 Personal history of other mental and behavioral disorders: Secondary | ICD-10-CM | POA: Insufficient documentation

## 2012-09-24 DIAGNOSIS — R109 Unspecified abdominal pain: Secondary | ICD-10-CM

## 2012-09-24 DIAGNOSIS — R1110 Vomiting, unspecified: Secondary | ICD-10-CM

## 2012-09-24 DIAGNOSIS — R112 Nausea with vomiting, unspecified: Secondary | ICD-10-CM | POA: Insufficient documentation

## 2012-09-24 DIAGNOSIS — Z3202 Encounter for pregnancy test, result negative: Secondary | ICD-10-CM | POA: Insufficient documentation

## 2012-09-24 LAB — COMPREHENSIVE METABOLIC PANEL
ALT: 24 U/L (ref 0–35)
BUN: 15 mg/dL (ref 6–23)
Calcium: 8.9 mg/dL (ref 8.4–10.5)
Creatinine, Ser: 0.69 mg/dL (ref 0.50–1.10)
GFR calc Af Amer: >90 mL/min (ref >90)
Glucose, Bld: 94 mg/dL (ref 70–99)
Sodium: 139 mEq/L (ref 135–145)
Total Protein: 8.1 g/dL (ref 6.0–8.3)

## 2012-09-24 LAB — URINALYSIS, ROUTINE W REFLEX MICROSCOPIC
Bilirubin Urine: NEGATIVE
Ketones, ur: >80 mg/dL — AB
Nitrite: NEGATIVE
Urobilinogen, UA: 0.2 mg/dL (ref 0.0–1.0)
pH: 5 (ref 5.0–8.0)

## 2012-09-24 LAB — CBC WITH DIFFERENTIAL/PLATELET
Eosinophils Absolute: 0 10*3/uL (ref 0.0–0.7)
Eosinophils Relative: 0 % (ref 0–5)
Lymphs Abs: 0.3 10*3/uL — ABNORMAL LOW (ref 0.7–4.0)
MCH: 24.9 pg — ABNORMAL LOW (ref 26.0–34.0)
MCV: 74.4 fL — ABNORMAL LOW (ref 78.0–100.0)
Platelets: 228 10*3/uL (ref 150–400)
RBC: 4.61 MIL/uL (ref 3.87–5.11)

## 2012-09-24 LAB — LIPASE, BLOOD: Lipase: 19 U/L (ref 11–59)

## 2012-09-24 MED ORDER — KETOROLAC TROMETHAMINE 30 MG/ML IJ SOLN
30.0000 mg | Freq: Once | INTRAMUSCULAR | Status: AC
  Administered 2012-09-24: 30 mg via INTRAVENOUS
  Filled 2012-09-24: qty 1

## 2012-09-24 MED ORDER — METOCLOPRAMIDE HCL 10 MG PO TABS: 10.0000 mg | ORAL_TABLET | Freq: Four times a day (QID) | ORAL | Status: DC | PRN

## 2012-09-24 MED ORDER — SODIUM CHLORIDE 0.9 % IV BOLUS (SEPSIS)
1000.0000 mL | Freq: Once | INTRAVENOUS | Status: AC
  Administered 2012-09-24: 1000 mL via INTRAVENOUS

## 2012-09-24 MED ORDER — ONDANSETRON HCL 4 MG/2ML IJ SOLN
4.0000 mg | Freq: Once | INTRAMUSCULAR | Status: AC
  Administered 2012-09-24: 4 mg via INTRAVENOUS
  Filled 2012-09-24: qty 2

## 2012-09-24 NOTE — ED Notes (Signed)
Liquid given to pt per Silverio Lay, MD verbal order.

## 2012-09-24 NOTE — ED Provider Notes (Signed)
History     CSN: [id number]  Arrival date & time 09/24/12  1028   First MD Initiated Contact with Patient 09/24/12 1039      Chief Complaint  Patient presents with  . Emesis  . Diarrhea    (Consider location/radiation/quality/duration/timing/severity/associated sxs/prior treatment) The history is provided by the patient.  Doris Guerrero is a 27 y.o. female history of anxiety, bipolar here presenting with abdominal pain and vomiting and diarrhea. Acute onset of vomiting 10 PM yesterday. She had several episodes of nonbilious and nonbloody vomiting. She also has several episodes of diarrhea. He was unable to keep anything down since yesterday. She also had crampy abdominal pain as diffuse and worse with vomiting and diarrhea. No fevers or urinary symptoms. No flank pain denies being pregnant.    Past Medical History  Diagnosis Date  . Anxiety   . Mental disorder     bipolar, on meds    Past Surgical History  Procedure Laterality Date  . No past surgeries      History reviewed. No pertinent family history.  History  Substance Use Topics  . Smoking status: Never Smoker   . Smokeless tobacco: Not on file  . Alcohol Use: No    OB History   Grav Para Term Preterm Abortions TAB SAB Ect Mult Living   0 0 0 0 0 0 0 0 0 0       Review of Systems  Gastrointestinal: Positive for nausea, vomiting, abdominal pain and diarrhea.  All other systems reviewed and are negative.    Allergies  Review of patient's allergies indicates no known allergies.  Home Medications   Current Outpatient Rx  Name  Route  Sig  Dispense  Refill  . metoCLOPramide (REGLAN) 10 MG tablet   Oral   Take 1 tablet (10 mg total) by mouth every 6 (six) hours as needed (nausea/headache).   8 tablet   0     BP 84/47  Pulse 92  Temp(Src) 98.6 F (37 C) (Oral)  Resp 18  SpO2 99%  LMP 09/03/2012  Physical Exam  Nursing note and vitals reviewed. Constitutional: She is oriented to person,  place, and time. She appears well-developed.  Uncomfortable, crunched up in bed   HENT:  Head: Normocephalic.  MM dry   Eyes: Conjunctivae are normal. Pupils are equal, round, and reactive to light.  Neck: Normal range of motion. Neck supple.  Cardiovascular: Normal rate, regular rhythm and normal heart sounds.   Pulmonary/Chest: Effort normal and breath sounds normal. No respiratory distress. She has no wheezes. She has no rales.  Abdominal: Soft.  + mild diffuse tenderness, no rebound. No CVAT   Musculoskeletal: Normal range of motion.  Neurological: She is alert and oriented to person, place, and time.  Skin: Skin is warm and dry.  Psychiatric: She has a normal mood and affect. Her behavior is normal. Judgment and thought content normal.    ED Course  Procedures (including critical care time)  Labs Reviewed  URINALYSIS, ROUTINE W REFLEX MICROSCOPIC - Abnormal; Notable for the following:    APPearance CLOUDY (*)    Specific Gravity, Urine 1.034 (*)    Ketones, ur >80 (*)    All other components within normal limits  CBC WITH DIFFERENTIAL - Abnormal; Notable for the following:    Hemoglobin 11.5 (*)    HCT 34.3 (*)    MCV 74.4 (*)    MCH 24.9 (*)    Neutrophils Relative 89 (*)  Lymphocytes Relative 7 (*)    Lymphs Abs 0.3 (*)    All other components within normal limits  COMPREHENSIVE METABOLIC PANEL - Abnormal; Notable for the following:    Alkaline Phosphatase 28 (*)    All other components within normal limits  PREGNANCY, URINE  LIPASE, BLOOD   No results found.   1. Vomiting   2. Abdominal pain       MDM  Doris Guerrero is a 27 y.o. female here with ab pain, vomiting, diarrhea. Likely viral syndrome. Will get basic labs, UA, UCG. Will give pain meds, zofran and IVF and reassess.   2:30 PM Felt better, not nauseous anymore.   3:39 PM After 2 L NS, patient now hypotensive to 80s. Will give more IVF. I signed out to Dr. Hyacinth Meeker to reassess the patient.          Richardean Canal, MD 09/24/12 1540

## 2012-09-24 NOTE — ED Notes (Signed)
Notified Dr. Hyacinth Meeker of vitals.  Dr. Hyacinth Meeker vitals always be done in full not simply BP.

## 2012-09-24 NOTE — ED Notes (Signed)
Pt cannot urinate and refuses in and out cath

## 2012-09-24 NOTE — ED Provider Notes (Signed)
  Physical Exam  BP 103/65  Pulse 82  Temp(Src) 98.5 F (36.9 C) (Oral)  Resp 14  SpO2 100%  LMP 09/03/2012  Physical Exam  ED Course  Procedures  MDM Change of shift, care accepted in sign out from Dr. Silverio Lay - pt examined and has improved BP, no tachcyardia, no fever and minimal abd discomfort - nausea is improved with meds but persistent - improves with zofran, fluids given, stable for d/c.  Informed of incdications for return, voices understanding.      Vida Roller, MD 09/24/12 [phone]

## 2013-05-09 ENCOUNTER — Emergency Department (HOSPITAL_COMMUNITY): Payer: Medicaid Other

## 2013-05-09 ENCOUNTER — Encounter (HOSPITAL_COMMUNITY): Payer: Self-pay | Admitting: Emergency Medicine

## 2013-05-09 ENCOUNTER — Emergency Department (HOSPITAL_COMMUNITY)
Admission: EM | Admit: 2013-05-09 | Discharge: 2013-05-09 | Disposition: A | Discharge status: Home / Self Care | Payer: Medicaid Other | Attending: Emergency Medicine | Admitting: Emergency Medicine

## 2013-05-09 DIAGNOSIS — M545 Low back pain, unspecified: Secondary | ICD-10-CM | POA: Insufficient documentation

## 2013-05-09 DIAGNOSIS — Z8659 Personal history of other mental and behavioral disorders: Secondary | ICD-10-CM | POA: Insufficient documentation

## 2013-05-09 DIAGNOSIS — M542 Cervicalgia: Secondary | ICD-10-CM | POA: Insufficient documentation

## 2013-05-09 DIAGNOSIS — G8929 Other chronic pain: Secondary | ICD-10-CM | POA: Insufficient documentation

## 2013-05-09 DIAGNOSIS — M419 Scoliosis, unspecified: Secondary | ICD-10-CM | POA: Insufficient documentation

## 2013-05-09 DIAGNOSIS — R51 Headache: Secondary | ICD-10-CM | POA: Insufficient documentation

## 2013-05-09 HISTORY — DX: Other chronic pain: G89.29

## 2013-05-09 HISTORY — DX: Dorsalgia, unspecified: M54.9

## 2013-05-09 HISTORY — DX: Scoliosis, unspecified: M41.9

## 2013-05-09 MED ORDER — HYDROMORPHONE HCL PF 2 MG/ML IJ SOLN
2.0000 mg | Freq: Once | INTRAMUSCULAR | Status: AC
  Administered 2013-05-09: 2 mg via INTRAMUSCULAR
  Filled 2013-05-09: qty 1

## 2013-05-09 MED ORDER — HYDROCODONE-ACETAMINOPHEN 5-325 MG PO TABS: 1.0000 | ORAL_TABLET | Freq: Four times a day (QID) | ORAL | Status: DC | PRN

## 2013-05-09 MED ORDER — CYCLOBENZAPRINE HCL 10 MG PO TABS: 10.0000 mg | ORAL_TABLET | Freq: Two times a day (BID) | ORAL | Status: DC | PRN

## 2013-05-09 NOTE — ED Notes (Signed)
Reports has had chronic back pain ever since diagnosed with scoliosis 10 yrs ago. States pain worsened 2 days ago & now radiating up into posterior head. Denies injury, n/v. States pain worse with mvmt. Pt ambulatory from triage

## 2013-05-09 NOTE — ED Notes (Signed)
C/o upper back pain & h/a x 2 days. Denies injury.

## 2013-05-09 NOTE — ED Provider Notes (Signed)
CSN: [id number]     Arrival date & time 05/09/13  1013 History   First MD Initiated Contact with Patient 05/09/13 1032     Chief Complaint  Patient presents with  . Back Pain  . Headache   (Consider location/radiation/quality/duration/timing/severity/associated sxs/prior Treatment) Patient is a 27 y.o. female presenting with back pain and headaches. The history is provided by the patient.  Back Pain Associated symptoms: headaches   Associated symptoms: no chest pain, no dysuria, no fever, no numbness and no weakness   Headache Associated symptoms: back pain and neck pain   Associated symptoms: no congestion, no fever, no nausea, no neck stiffness, no numbness and no vomiting    patient with a history of chronic back pain. Has not had difficulties for several months. Last time she had an x-ray was over a year ago. Patient with complaint of one week of lumbar back pain that radiates up to the base of her neck. She does cause a headache in the back of the head but is not like her migraines. Patient has no pain radiation into her legs no numbness or weakness to her feet. No numbness or weakness to her arms. No photophobia no nausea or vomiting. No fever no chills no congestion  Past Medical History  Diagnosis Date  . Anxiety   . Mental disorder     bipolar, on meds  . Scoliosis   . Chronic back pain    Past Surgical History  Procedure Laterality Date  . No past surgeries     No family history on file. History  Substance Use Topics  . Smoking status: Never Smoker   . Smokeless tobacco: Not on file  . Alcohol Use: No   OB History   Grav Para Term Preterm Abortions TAB SAB Ect Mult Living   0 0 0 0 0 0 0 0 0 0      Review of Systems  Constitutional: Negative for fever.  HENT: Negative for congestion.   Eyes: Negative for visual disturbance.  Respiratory: Negative for shortness of breath.   Cardiovascular: Negative for chest pain.  Gastrointestinal: Negative for nausea and  vomiting.  Genitourinary: Negative for dysuria.  Musculoskeletal: Positive for back pain and neck pain. Negative for neck stiffness.  Skin: Negative for rash.  Neurological: Positive for headaches. Negative for weakness and numbness.  Hematological: Does not bruise/bleed easily.  Psychiatric/Behavioral: Negative for confusion.    Allergies  Review of patient's allergies indicates no known allergies.  Home Medications   Current Outpatient Rx  Name  Route  Sig  Dispense  Refill  . cyclobenzaprine (FLEXERIL) 10 MG tablet   Oral   Take 1 tablet (10 mg total) by mouth 2 (two) times daily as needed for muscle spasms.   20 tablet   0   . HYDROcodone-acetaminophen (NORCO/VICODIN) 5-325 MG per tablet   Oral   Take 1-2 tablets by mouth every 6 (six) hours as needed for pain.   20 tablet   0    BP 109/74  Pulse 81  Temp(Src) 98.6 F (37 C) (Oral)  Resp 16  Ht 5' (1.524 m)  Wt 90 lb (40.824 kg)  BMI 17.58 kg/m2  SpO2 100%  LMP 04/28/2013 Physical Exam  Nursing note and vitals reviewed. Constitutional: She is oriented to person, place, and time. She appears well-developed and well-nourished. No distress.  HENT:  Head: Normocephalic and atraumatic.  Mouth/Throat: Oropharynx is clear and moist.  Eyes: Conjunctivae and EOM are normal. Pupils are equal,  round, and reactive to light.  Neck: Normal range of motion. Neck supple.  Neck with full range of motion nontender to palpation.  Cardiovascular: Normal rate, regular rhythm and normal heart sounds.   No murmur heard. Pulmonary/Chest: Effort normal and breath sounds normal. No respiratory distress.  Abdominal: Soft. Bowel sounds are normal. There is no tenderness.  Musculoskeletal: She exhibits tenderness.  Tenderness to palpation to the lumbar part of the back no obvious muscle spasm. No lower extremity  focal deficits.  Neurological: She is alert and oriented to person, place, and time. No cranial nerve deficit. She exhibits  normal muscle tone. Coordination normal.  Skin: Skin is warm. No rash noted.    ED Course  Procedures (including critical care time) Labs Review Labs Reviewed - No data to display Imaging Review Dg Lumbar Spine Complete  05/09/2013   CLINICAL DATA:  Low back pain  EXAM: LUMBAR SPINE - COMPLETE 4+ VIEW  COMPARISON:  None.  FINDINGS: Anatomic alignment. No compression deformity. Minimal narrowing of the L4-5 disc. No pars defect.  IMPRESSION: No acute bony pathology.   Electronically Signed   By: Maryclare Bean M.D.   On: 05/09/2013 12:06    EKG Interpretation   None       MDM   1. Lumbar back pain   2. Chronic back pain    Patient with history of back problems in the past but it's been several months. Patient has not had an x-ray of her back in the past year. Patient states that this is worse than usual. X-rays are negative for any acute injury. There is no focal neuro deficits. No palpable muscle spasm here. We'll treat with pain medication and Flexeril. Patient is in the process of getting a new primary care Dr.    Shelda Jakes, MD 05/09/13 [phone]

## 2013-06-21 ENCOUNTER — Emergency Department (HOSPITAL_COMMUNITY)
Admission: EM | Admit: 2013-06-21 | Discharge: 2013-06-21 | Discharge status: Against Medical Advice | Payer: Medicaid Other | Attending: Emergency Medicine | Admitting: Emergency Medicine

## 2013-06-21 ENCOUNTER — Encounter (HOSPITAL_COMMUNITY): Payer: Self-pay | Admitting: Emergency Medicine

## 2013-06-21 ENCOUNTER — Emergency Department (HOSPITAL_COMMUNITY): Payer: Medicaid Other

## 2013-06-21 DIAGNOSIS — R0602 Shortness of breath: Secondary | ICD-10-CM | POA: Insufficient documentation

## 2013-06-21 DIAGNOSIS — F489 Nonpsychotic mental disorder, unspecified: Secondary | ICD-10-CM | POA: Insufficient documentation

## 2013-06-21 DIAGNOSIS — M4120 Other idiopathic scoliosis, site unspecified: Secondary | ICD-10-CM | POA: Insufficient documentation

## 2013-06-21 DIAGNOSIS — M549 Dorsalgia, unspecified: Secondary | ICD-10-CM | POA: Insufficient documentation

## 2013-06-21 DIAGNOSIS — R079 Chest pain, unspecified: Secondary | ICD-10-CM | POA: Insufficient documentation

## 2013-06-21 DIAGNOSIS — F411 Generalized anxiety disorder: Secondary | ICD-10-CM | POA: Insufficient documentation

## 2013-06-21 DIAGNOSIS — G8929 Other chronic pain: Secondary | ICD-10-CM | POA: Insufficient documentation

## 2013-06-21 LAB — CBC
Hemoglobin: 11.4 g/dL — ABNORMAL LOW (ref 12.0–15.0)
RBC: 4.48 MIL/uL (ref 3.87–5.11)
WBC: 6.4 10*3/uL (ref 4.0–10.5)

## 2013-06-21 LAB — BASIC METABOLIC PANEL
CO2: 27 mEq/L (ref 19–32)
Glucose, Bld: 96 mg/dL (ref 70–99)
Potassium: 4.1 mEq/L (ref 3.5–5.1)
Sodium: 137 mEq/L (ref 135–145)

## 2013-06-21 LAB — POCT I-STAT TROPONIN I: Troponin i, poc: 0.02 ng/mL (ref 0.00–0.08)

## 2013-06-21 NOTE — ED Notes (Signed)
Pt reports was sweeping and started to have midsternal CP, does report SOB, denies n/v

## 2013-06-21 NOTE — ED Notes (Signed)
Attempted to locate pt x 1.

## 2013-06-21 NOTE — ED Notes (Signed)
Per EMS pt with sudden onset of chest pain; pt describes pain as sharp in nature; sts it increases with inspiration and palpation. Denies shortness of breath, cough, any injury.  EKG showed NSR with no ectopy.

## 2013-06-21 NOTE — ED Notes (Signed)
Unable to locate pt x2

## 2014-02-24 ENCOUNTER — Encounter (HOSPITAL_COMMUNITY): Payer: Self-pay | Admitting: Emergency Medicine

## 2014-02-24 ENCOUNTER — Emergency Department (HOSPITAL_COMMUNITY)
Admission: EM | Admit: 2014-02-24 | Discharge: 2014-02-25 | Disposition: A | Discharge status: Home / Self Care | Payer: Medicaid Other | Attending: Emergency Medicine | Admitting: Emergency Medicine

## 2014-02-24 DIAGNOSIS — G8929 Other chronic pain: Secondary | ICD-10-CM | POA: Diagnosis not present

## 2014-02-24 DIAGNOSIS — M545 Low back pain, unspecified: Secondary | ICD-10-CM | POA: Diagnosis not present

## 2014-02-24 DIAGNOSIS — M546 Pain in thoracic spine: Secondary | ICD-10-CM | POA: Diagnosis present

## 2014-02-24 DIAGNOSIS — Z8659 Personal history of other mental and behavioral disorders: Secondary | ICD-10-CM | POA: Insufficient documentation

## 2014-02-24 DIAGNOSIS — M5489 Other dorsalgia: Secondary | ICD-10-CM

## 2014-02-24 NOTE — ED Notes (Signed)
The pt is c/o neck and back pain for 2 days .  She reports that she has scoliosis

## 2014-02-25 MED ORDER — NAPROXEN 500 MG PO TABS: 500.0000 mg | ORAL_TABLET | Freq: Two times a day (BID) | ORAL | Status: DC

## 2014-02-25 MED ORDER — METHOCARBAMOL 500 MG PO TABS
500.0000 mg | ORAL_TABLET | Freq: Two times a day (BID) | ORAL | Status: DC
Start: 2014-02-25 — End: 2015-01-26

## 2014-02-25 MED ORDER — HYDROCODONE-ACETAMINOPHEN 5-325 MG PO TABS: 1.0000 | ORAL_TABLET | Freq: Four times a day (QID) | ORAL | Status: DC | PRN

## 2014-02-25 NOTE — ED Provider Notes (Signed)
CSN: [id number]     Arrival date & time 02/24/14  2337 History   First MD Initiated Contact with Patient 02/25/14 0008     Chief Complaint  Patient presents with  . Back Pain     (Consider location/radiation/quality/duration/timing/severity/associated sxs/prior Treatment) HPI Comments: Patient with a history of Scoliosis presents today with a chief complaint of right sided upper and lower back pain.  Pain has been present for the past 2 days and is gradually worsening.  Pain worse with movement.  Pain does not radiate.  She reports that she has had similar pain in the past.  Denies acute injury or trauma.  Denies numbness or tingling.  No loss of bowel/bladder function.  Denies abdominal pain.  No fever or chills.  She has taken OTC pain medication without relief.  No history of Cancer or IVDU.    The history is provided by the patient.    Past Medical History  Diagnosis Date  . Anxiety   . Mental disorder     bipolar, on meds  . Scoliosis   . Chronic back pain    Past Surgical History  Procedure Laterality Date  . No past surgeries     No family history on file. History  Substance Use Topics  . Smoking status: Never Smoker   . Smokeless tobacco: Not on file  . Alcohol Use: No   OB History   Grav Para Term Preterm Abortions TAB SAB Ect Mult Living   0 0 0 0 0 0 0 0 0 0      Review of Systems  Constitutional: Negative for fever and chills.  Genitourinary:       No bowel or bladder incontinence  Musculoskeletal: Positive for back pain.  Neurological: Negative for numbness.      Allergies  Review of patient's allergies indicates no known allergies.  Home Medications   Prior to Admission medications   Not on File   BP 115/74  Pulse 87  Temp(Src) 98.7 F (37.1 C) (Oral)  Resp 18  Wt 92 lb (41.731 kg)  SpO2 99% Physical Exam  Nursing note and vitals reviewed. Constitutional: She appears well-developed and well-nourished.  HENT:  Head: Normocephalic and  atraumatic.  Cardiovascular: Normal rate, regular rhythm and normal heart sounds.   Pulmonary/Chest: Effort normal and breath sounds normal.  Musculoskeletal:       Cervical back: She exhibits normal range of motion, no tenderness, no bony tenderness, no swelling, no edema and no deformity.       Thoracic back: She exhibits tenderness and bony tenderness. She exhibits normal range of motion, no swelling, no edema and no deformity.       Lumbar back: She exhibits tenderness and bony tenderness. She exhibits normal range of motion, no swelling, no edema and no deformity.  Neurological: She is alert. She has normal strength. No sensory deficit. Gait normal.  Reflex Scores:      Patellar reflexes are 2+ on the right side and 2+ on the left side. Skin: Skin is warm and dry.  Psychiatric: She has a normal mood and affect.    ED Course  Procedures (including critical care time) Labs Review Labs Reviewed - No data to display  Imaging Review No results found.   EKG Interpretation None      MDM   Final diagnoses:  None   Patient with back pain.  No neurological deficits and normal neuro exam.  Patient can walk but states is painful.  No loss  of bowel or bladder control.  No concern for cauda equina.  No fever, night sweats, weight loss, h/o cancer, IVDU.  RICE protocol and pain medicine indicated and discussed with patient.  Patient stable for discharge.  Return precautions given.     Santiago GladHeather Smriti Barkow, PA-C 02/26/14 0034  Santiago GladHeather Charolette Bultman, PA-C 02/26/14 [phone]

## 2014-02-26 NOTE — ED Provider Notes (Signed)
Medical screening examination/treatment/procedure(s) were performed by non-physician practitioner and as supervising physician I was immediately available for consultation/collaboration.   EKG Interpretation None       Doug SouSam Jazzmen Restivo, MD 02/26/14 [phone]

## 2014-05-29 ENCOUNTER — Emergency Department (HOSPITAL_COMMUNITY)
Admission: EM | Admit: 2014-05-29 | Discharge: 2014-05-29 | Disposition: A | Discharge status: Home / Self Care | Payer: Medicaid Other | Attending: Emergency Medicine | Admitting: Emergency Medicine

## 2014-05-29 ENCOUNTER — Encounter (HOSPITAL_COMMUNITY): Payer: Self-pay

## 2014-05-29 DIAGNOSIS — Z8659 Personal history of other mental and behavioral disorders: Secondary | ICD-10-CM | POA: Insufficient documentation

## 2014-05-29 DIAGNOSIS — Z79899 Other long term (current) drug therapy: Secondary | ICD-10-CM | POA: Insufficient documentation

## 2014-05-29 DIAGNOSIS — K088 Other specified disorders of teeth and supporting structures: Secondary | ICD-10-CM | POA: Diagnosis present

## 2014-05-29 DIAGNOSIS — G8929 Other chronic pain: Secondary | ICD-10-CM | POA: Insufficient documentation

## 2014-05-29 DIAGNOSIS — Z791 Long term (current) use of non-steroidal anti-inflammatories (NSAID): Secondary | ICD-10-CM | POA: Diagnosis not present

## 2014-05-29 DIAGNOSIS — R112 Nausea with vomiting, unspecified: Secondary | ICD-10-CM | POA: Diagnosis not present

## 2014-05-29 DIAGNOSIS — K068 Other specified disorders of gingiva and edentulous alveolar ridge: Secondary | ICD-10-CM

## 2014-05-29 DIAGNOSIS — M419 Scoliosis, unspecified: Secondary | ICD-10-CM | POA: Insufficient documentation

## 2014-05-29 MED ORDER — LIDOCAINE VISCOUS 2 % MT SOLN: 10.0000 mL | OROMUCOSAL | Status: DC | PRN

## 2014-05-29 MED ORDER — HYDROCODONE-ACETAMINOPHEN 5-325 MG PO TABS: 1.0000 | ORAL_TABLET | ORAL | Status: DC | PRN

## 2014-05-29 NOTE — ED Notes (Signed)
Pt states dental pain x 2 weeks.  Pt states she has tried everything without success.  Peroxide rinses, meds etc.  No dentist

## 2014-05-29 NOTE — Discharge Instructions (Signed)
Read the information below.  Use the prescribed medication as directed.  Please discuss all new medications with your pharmacist.  Do not take additional tylenol while taking the prescribed pain medication to avoid overdose.  You may return to the Emergency Department at any time for worsening condition or any new symptoms that concern you.  Please call the dentist listed above within 48 hours to schedule a close follow up appointment.  If you develop fevers, swelling in your face, difficulty swallowing or breathing, return to the ER immediately for a recheck.     Gingivitis  Gingivitis is an infection of the teeth and bones that support the teeth. Your gums become red, sore, and puffy (swollen). It is caused by germs that build up on your teeth and gums (plaque). HOME CARE  Floss and then brush your teeth.  Brush at least twice a day.  Floss at least once a day.  Avoid sugar between meals.  Do not drink juice before bed. Only drink water.  Make and keep your regular checkups and cleanings with your dentist.  Use any mouth care product or toothpaste as told by your dentist. GET HELP RIGHT AWAY IF:  You have painful, red tissue around your teeth.  You have trouble chewing.  You have loose or infected teeth. MAKE SURE YOU:  Understand these instructions.  Will watch your condition.  Will get help right away if you are not doing well or get worse. Document Released: 08/02/2010 Document Revised: 09/22/2011 Document Reviewed: 10/04/2010 [facility] Patient Information 2015 [place], [place]. This information is not intended to replace advice given to you by your health care provider. Make sure you discuss any questions you have with your health care provider.   Emergency Department Resource Guide 1) Find a Doctor and Pay Out of Pocket Although you won't have to find out who is covered by your insurance plan, it is a good idea to ask around and get recommendations. You will then need  to call the office and see if the doctor you have chosen will accept you as a new patient and what types of options they offer for patients who are self-pay. Some doctors offer discounts or will set up payment plans for their patients who do not have insurance, but you will need to ask so you aren't surprised when you get to your appointment.  2) Contact Your Local Health Department Not all health departments have doctors that can see patients for sick visits, but many do, so it is worth a call to see if yours does. If you don't know where your local health department is, you can check in your phone book. The CDC also has a tool to help you locate your state's health department, and many state websites also have listings of all of their local health departments.  3) Find a Walk-in Clinic If your illness is not likely to be very severe or complicated, you may want to try a walk in clinic. These are popping up all over the country in pharmacies, drugstores, and shopping centers. They're usually staffed by nurse practitioners or physician assistants that have been trained to treat common illnesses and complaints. They're usually fairly quick and inexpensive. However, if you have serious medical issues or chronic medical problems, these are probably not your best option.  No Primary Care Doctor: - Call Health Connect at  [phone] - they can help you locate a primary care doctor that  accepts your insurance, provides certain services, etc. -  Physician Referral Service- [phone]  Chronic Pain Problems: Organization         Address  Phone   Notes  [facility]  [phone] Patients need to be referred by their primary care doctor.   Medication Assistance: Organization         Address  Phone   Notes  [facility] Medication [facility] [address] [place], [place] [address] [phone] --Must be a resident of [facility] -- Must have NO insurance  coverage whatsoever (no Medicaid/ Medicare, etc.) -- The pt. MUST have a primary care doctor that directs their care regularly and follows them in the community   [facility]ist  [phone]   [employer]  [phone]    Agencies that provide inexpensive medical care: Organization         Address  Phone   Notes  [facility]  [phone]   [facility]    [phone]   [facility] [address] [place], [place] [address] [phone]   [facility] [address] [place], [place] [phone]   Planned Parenthood    [phone]   [facility]    [phone]   [facility]  [address], [place] Phone:  [phone], Fax:  [phone] Hours of Operation:  9 am - 6 pm, M-F.  Also accepts Medicaid/Medicare and self-pay.  [facility]  [address], Dawnita Molner [place] Phone: [phone], Fax: [phone]. Hours of Operation:  8:30 am - 5:30 pm, M-F.  Also accepts Medicaid and self-pay.  [facility] [place] [address] [place] Phone: [phone]   [facility] [address] Natasha Bence [place], [place] [phone], Ext. [phone] Mondays & Thursdays: 7-9 AM.  First 15 patients are seen on a first come, first serve basis.    Medicaid-accepting [facility] Providers:  Organization         Address  Phone   Notes  [facility] [address],  [phone] Also accepts self-pay patients.  [facility] [address] Laurell Josephs [place], [place]  [phone]   [facility] [address], [place] [phone]   [facility] [address], [place] [phone]   Renaye Rakers [address], [place]   [phone] Only accepts [place] patients after they have their  name applied to their card.   Self-Pay (no insurance) in [facility]:  Organization         Address  Phone   Notes  Sickle Cell Patients, [facility] [address] [place], [place] [phone]   [facility] [address] [place], [place] [phone]   [facility] [place]  [address], [place] [phone]   [facility]/Dr. Osei-Bonsu  [address], [place] or [address], [place] [phone] Phone number for both [place] and [place] locations is the same.  [facility] [address], [place] [phone]   [facility] [address] [place] or [address] [phone]   [facility] [address] [place], [place] [phone],  phone; [phone](336) [phone], fax Sees patients 1st and 3rd Saturday of every month.  Must not qualify for public or private insurance (i.e. Medicaid, Medicare, [place] Health Choice, Veterans' Benefits)  Household income should be no more than 200% of the poverty level The clinic cannot treat you if you are pregnant or think you are pregnant  Sexually transmitted diseases are not treated at the clinic.    Dental Care: Organization         Address  Phone  Notes  [facility] [address], [place] [phone] Accepts children up to age 26 who are enrolled in [place]icaid or [place] Health Choice; pregnant women with a Medicaid card; and children who have applied for Medicaid or [place] Health Choice, but were declined, whose parents can pay a reduced fee at time of service.  [facility] [place]  [address], [place] [phone] Accepts children up to age 26 who are enrolled in [place]icaid or [place] Health Choice; pregnant women with a Medicaid card; and children who have applied for  Medicaid or [place] Health Choice, but were declined, whose parents can pay a reduced fee at time of service.  [facility]  [address], [place] [phone] Patients are seen by appointment only. Walk-ins are not accepted. [facility] will see patients 26 years of age and older. Monday - Tuesday (8am-5pm) Most Wednesdays (8:30-5pm) $30 per visit, cash only  [facility]  [address], [facility] [phone] Patients are seen by appointment only. Walk-ins are not accepted. [facility] will see patients 26 years of age and older. One Wednesday Evening (Monthly: Volunteer Based).  $30 per visit, cash only  [employer] [facility] [facility]  [phone] for adults; Children under age 694, call Graduate Pediatric Dentistry at [phone]. Children aged 174-14, please call [phone] to request a pediatric application.  Dental services are provided in all areas of dental care including fillings, crowns and bridges, complete and partial dentures, implants, gum treatment, root canals, and extractions. Preventive care is also provided. Treatment is provided to both adults and children. Patients are selected via a lottery and there is often a waiting list.   [facility] [address], [place]  [phone]) [phone] [email or web address]   [facility] [address], [place], [place] [phone], Ext. [phone] Second and Fourth Thursday of each month, opens at 6:30 AM; Clinic ends at 9 AM.  Patients are seen on a first-come first-served basis, and a limited number are seen during each clinic.   [facility]  [address], [place], [place] [phone]   Eligibility Requirements You must have lived in [place], [place], or [place] counties for at least the last three months.   You cannot be eligible for state or federal sponsored [place] Cityhealthcare insurance, including CIGNAVeterans Administration, [place]icaid,  or [employer]Medicare.   You generally cannot be eligible for healthcare insurance through your employer.    How to apply: Eligibility screenings are held every Tuesday and Wednesday afternoon from 1:00 pm until 4:00 pm. You do not need an appointment for the interview!  [facility] [address], [place], [place] [phone]   [facility]  [phone]   [facility]  [phone]   [facility]  [phone]    Behavioral Health Resources in  the Community: Intensive Outpatient Programs Organization         Address  Phone  Notes  [address] [facility] [address], [place], [place] [phone]   [facility] [address], [place], [place] [phone]   [facility] [address], [place], [place]  [phone]   [facility] [address],  [place], [place] [phone] or [phone]   Substance Abuse Resources Organization         Address  Phone  Notes  [facility]  [phone]   [facility]  [phone]   [facility]  [phone]   [facility]  [phone]   Residential & Outpatient Substance Abuse Program  [phone]   Psychological Services Organization         Address  Phone  Notes  [facility]  [phone]- [phone]   [facility]  [phone]- [phone]   [facility] [address], [place] [phone] or [phone]    Mobile Crisis Teams Organization         Address  Phone  Notes  [facility], Mobile Crisis Care Unit  [phone]   [facility]  [address] Eiliyah Reh [place], [place] [phone]   Doristine LocksSharon DeEsch [address] [place] [phone]    Self-Help/Support Groups Organization         Address  Phone             Notes  [facility] [place] - variety of support groups   336- [phone] Call for more information  Narcotics Anonymous (NA), [facility] [address], [place]  2 meetings at this location   Statisticianesidential Treatment Programs Organization         Address  Phone  Notes  [facility] [address],    [place]  [phone]   [facility]  [address], [place] [phone], [place], [place] [phone]   [facility] [address], [place] [phone] Admissions: 8am-3pm M-F  [facility] [address]    [place], [place] [phone]   [facility] [address], [place], [place] [phone]   [facility] [address]  [place], [place] [phone]   [facility] - Intensive Outpatient [address], [place], [place] [phone]   [facility] ([facility] [address]  [place], [place] [phone] or [phone]   [facility] ([facility]) [address] [place], [place] [phone] Accepts Medicaid  [facility] [address]  [place] [phone] Substance Abuse/Addiction Treatment   [facility] Resources Organization         Address  Phone  Notes  [facility]  [phone]   Angie FavaJulie Brannon, PhD [address], [address] [place], [place]   [phone] or [phone](336) [phone]   [facility]   [address] [place], [place] [phone]) [phone]   [facility] [address], [place], [place] [phone] Insurance/Medicaid/sponsorship through [facility]  [facility] Families [address]                                    [place], [place] [phone] Therapy/tele-psych/case  [facility] [address].   [place], [place] [phone]) [phone]  Dr. Lolly Mustache  [phone]   [facility]  [employer] [facility] Dept. 1) [address] [place]isa [place] 2) [address], [place] 3)  [address], [place] [phone]  [phone]   [facility] Child Abuse Hotline [phone] or [phone] (After Hours)

## 2014-05-29 NOTE — ED Provider Notes (Signed)
CSN: [id number]     Arrival date & time 05/29/14  1105 History  This chart was scribed for non-physician practitioner, Trixie DredgeEmily Robin Petrakis, PA-C working with Richardean Canalavid H Yao, MD by Greggory StallionKayla Andersen, ED scribe. This patient was seen in room WTR6/WTR6 and the patient's care was started at 12:05 PM.   Chief Complaint  Patient presents with  . Dental Pain   The history is provided by the patient. No language interpreter was used.    HPI Comments: Doris Guerrero is a 28 y.o. female who presents to the Emergency Department complaining of constant, throbbing right lower dental pain that started 2 weeks ago. Eating worsens the pain. Reports nausea and an episode of emesis last night. She has taken ibuprofen, aleve and BC powders with no relief. States she has never had her wisdom teeth removed. Denies fever, sore throat, ear pain, cough, SOB, chest pain, diarrhea, constipation.   Past Medical History  Diagnosis Date  . Anxiety   . Mental disorder     bipolar, on meds  . Scoliosis   . Chronic back pain    Past Surgical History  Procedure Laterality Date  . No past surgeries     History reviewed. No pertinent family history. History  Substance Use Topics  . Smoking status: Never Smoker   . Smokeless tobacco: Not on file  . Alcohol Use: No   OB History    Gravida Para Term Preterm AB TAB SAB Ectopic Multiple Living   0 0 0 0 0 0 0 0 0 0      Review of Systems  Constitutional: Negative for fever.  HENT: Positive for dental problem. Negative for ear pain and sore throat.   Respiratory: Negative for cough and shortness of breath.   Cardiovascular: Negative for chest pain.  Gastrointestinal: Positive for nausea and vomiting. Negative for diarrhea and constipation.  All other systems reviewed and are negative.  Allergies  Review of patient's allergies indicates no known allergies.  Home Medications   Prior to Admission medications   Medication Sig Start Date End Date Taking? Authorizing Provider   HYDROcodone-acetaminophen (NORCO/VICODIN) 5-325 MG per tablet Take 1-2 tablets by mouth every 6 (six) hours as needed. 02/25/14   Heather Laisure, PA-C  methocarbamol (ROBAXIN) 500 MG tablet Take 1 tablet (500 mg total) by mouth 2 (two) times daily. 02/25/14   Heather Laisure, PA-C  naproxen (NAPROSYN) 500 MG tablet Take 1 tablet (500 mg total) by mouth 2 (two) times daily. 02/25/14   Heather Laisure, PA-C   BP 109/73 mmHg  Pulse 65  Temp(Src) 98.3 F (36.8 C) (Oral)  Resp 18  SpO2 98%  LMP 05/04/2014   Physical Exam  Constitutional: She appears well-developed and well-nourished. No distress.  HENT:  Head: Normocephalic and atraumatic.  Mouth/Throat: Oropharynx is clear and moist.  Right lower gingiva over third tooth space edematous. Tender to palpation. No erythema. Third molar present on left lower side but not on right lower side.   Neck: Neck supple.  Cardiovascular: Normal rate and regular rhythm.   Pulmonary/Chest: Effort normal and breath sounds normal. No stridor.  Lymphadenopathy:    She has no cervical adenopathy.  Neurological: She is alert.  Skin: She is not diaphoretic.  Nursing note and vitals reviewed.   ED Course  Procedures (including critical care time)  DIAGNOSTIC STUDIES: Oxygen Saturation is 98% on RA, normal by my interpretation.    COORDINATION OF CARE: 12:08 PM-Discussed treatment plan which includes pain medication with pt at bedside  and pt agreed to plan. Will give pt dental referrals and advised her to follow up.   Labs Review Labs Reviewed - No data to display  Imaging Review No results found.   EKG Interpretation None      MDM   Final diagnoses:  Pain of gingiva    Afebrile, nontoxic patient with pain over right lower 3rd molar space.  Gingivitis vs eruption of wisdom tooth.  No e/o abscess.  Doubt ludwig's angina or deep space infection.   D/C home with norco, viscous lidocaine, dental follow up.  Discussed result, findings,  treatment, and follow up  with patient.  Pt given return precautions.  Pt verbalizes understanding and agrees with plan.       I personally performed the services described in this documentation, which was scribed in my presence. The recorded information has been reviewed and is accurate.  Trixie Dredgemily Jade Burkard, PA-C 05/29/14 1419  Richardean Canalavid H Yao, MD 05/29/14 [phone]

## 2015-01-26 ENCOUNTER — Emergency Department (HOSPITAL_COMMUNITY)
Admission: EM | Admit: 2015-01-26 | Discharge: 2015-01-26 | Disposition: A | Discharge status: Home / Self Care | Payer: Medicaid Other | Attending: Emergency Medicine | Admitting: Emergency Medicine

## 2015-01-26 ENCOUNTER — Encounter (HOSPITAL_COMMUNITY): Payer: Self-pay | Admitting: Emergency Medicine

## 2015-01-26 DIAGNOSIS — M419 Scoliosis, unspecified: Secondary | ICD-10-CM | POA: Insufficient documentation

## 2015-01-26 DIAGNOSIS — Z3202 Encounter for pregnancy test, result negative: Secondary | ICD-10-CM | POA: Diagnosis not present

## 2015-01-26 DIAGNOSIS — Z79891 Long term (current) use of opiate analgesic: Secondary | ICD-10-CM | POA: Diagnosis not present

## 2015-01-26 DIAGNOSIS — M546 Pain in thoracic spine: Secondary | ICD-10-CM | POA: Insufficient documentation

## 2015-01-26 DIAGNOSIS — G44209 Tension-type headache, unspecified, not intractable: Secondary | ICD-10-CM | POA: Diagnosis not present

## 2015-01-26 DIAGNOSIS — G8929 Other chronic pain: Secondary | ICD-10-CM | POA: Insufficient documentation

## 2015-01-26 DIAGNOSIS — Z8659 Personal history of other mental and behavioral disorders: Secondary | ICD-10-CM | POA: Diagnosis not present

## 2015-01-26 LAB — URINALYSIS, ROUTINE W REFLEX MICROSCOPIC
Bilirubin Urine: NEGATIVE
GLUCOSE, UA: NEGATIVE mg/dL
Hgb urine dipstick: NEGATIVE
Ketones, ur: NEGATIVE mg/dL
LEUKOCYTES UA: NEGATIVE
NITRITE: NEGATIVE
PH: 5.5 (ref 5.0–8.0)
PROTEIN: NEGATIVE mg/dL
SPECIFIC GRAVITY, URINE: 1.033 — AB (ref 1.005–1.030)
Urobilinogen, UA: 0.2 mg/dL (ref 0.0–1.0)

## 2015-01-26 LAB — POC URINE PREG, ED: Preg Test, Ur: NEGATIVE

## 2015-01-26 MED ORDER — SODIUM CHLORIDE 0.9 % IV BOLUS (SEPSIS)
1000.0000 mL | Freq: Once | INTRAVENOUS | Status: AC
  Administered 2015-01-26: 1000 mL via INTRAVENOUS

## 2015-01-26 MED ORDER — METOCLOPRAMIDE HCL 5 MG/ML IJ SOLN
10.0000 mg | Freq: Once | INTRAMUSCULAR | Status: AC
  Administered 2015-01-26: 10 mg via INTRAVENOUS
  Filled 2015-01-26: qty 2

## 2015-01-26 MED ORDER — DIPHENHYDRAMINE HCL 50 MG/ML IJ SOLN
25.0000 mg | Freq: Once | INTRAMUSCULAR | Status: AC
  Administered 2015-01-26: 25 mg via INTRAVENOUS
  Filled 2015-01-26: qty 1

## 2015-01-26 MED ORDER — KETOROLAC TROMETHAMINE 30 MG/ML IJ SOLN
15.0000 mg | Freq: Once | INTRAMUSCULAR | Status: AC
  Administered 2015-01-26: 15 mg via INTRAVENOUS
  Filled 2015-01-26: qty 1

## 2015-01-26 MED ORDER — HYDROCODONE-ACETAMINOPHEN 5-325 MG PO TABS: 1.0000 | ORAL_TABLET | Freq: Four times a day (QID) | ORAL | Status: DC | PRN

## 2015-01-26 NOTE — Discharge Instructions (Signed)
Back Pain, Adult  Low back pain is very common. About 1 in 5 people have back pain. The cause of low back pain is rarely dangerous. The pain often gets better over time. About half of people with a sudden onset of back pain feel better in just 2 weeks. About 8 in 10 people feel better by 6 weeks.   CAUSES  Some common causes of back pain include:  · Strain of the muscles or ligaments supporting the spine.  · Wear and tear (degeneration) of the spinal discs.  · Arthritis.  · Direct injury to the back.  DIAGNOSIS  Most of the time, the direct cause of low back pain is not known. However, back pain can be treated effectively even when the exact cause of the pain is unknown. Answering your caregiver's questions about your overall health and symptoms is one of the most accurate ways to make sure the cause of your pain is not dangerous. If your caregiver needs more information, he or she may order lab work or imaging tests (X-rays or MRIs). However, even if imaging tests show changes in your back, this usually does not require surgery.  HOME CARE INSTRUCTIONS  For many people, back pain returns. Since low back pain is rarely dangerous, it is often a condition that people can learn to manage on their own.   · Remain active. It is stressful on the back to sit or stand in one place. Do not sit, drive, or stand in one place for more than 30 minutes at a time. Take short walks on level surfaces as soon as pain allows. Try to increase the length of time you walk each day.  · Do not stay in bed. Resting more than 1 or 2 days can delay your recovery.  · Do not avoid exercise or work. Your body is made to move. It is not dangerous to be active, even though your back may hurt. Your back will likely heal faster if you return to being active before your pain is gone.  · Pay attention to your body when you  bend and lift. Many people have less discomfort when lifting if they bend their knees, keep the load close to their bodies, and  avoid twisting. Often, the most comfortable positions are those that put less stress on your recovering back.  · Find a comfortable position to sleep. Use a firm mattress and lie on your side with your knees slightly bent. If you lie on your back, put a pillow under your knees.  · Only take over-the-counter or prescription medicines as directed by your caregiver. Over-the-counter medicines to reduce pain and inflammation are often the most helpful. Your caregiver may prescribe muscle relaxant drugs. These medicines help dull your pain so you can more quickly return to your normal activities and healthy exercise.  · Put ice on the injured area.  · Put ice in a plastic bag.  · Place a towel between your skin and the bag.  · Leave the ice on for 15-20 minutes, 03-04 times a day for the first 2 to 3 days. After that, ice and heat may be alternated to reduce pain and spasms.  · Ask your caregiver about trying back exercises and gentle massage. This may be of some benefit.  · Avoid feeling anxious or stressed. Stress increases muscle tension and can worsen back pain. It is important to recognize when you are anxious or stressed and learn ways to manage it. Exercise is a great option.  SEEK MEDICAL CARE IF:  · You have pain that is not relieved with rest or   medicine.  · You have pain that does not improve in 1 week.  · You have new symptoms.  · You are generally not feeling well.  SEEK IMMEDIATE MEDICAL CARE IF:   · You have pain that radiates from your back into your legs.  · You develop new bowel or bladder control problems.  · You have unusual weakness or numbness in your arms or legs.  · You develop nausea or vomiting.  · You develop abdominal pain.  · You feel faint.  Document Released: 06/30/2005 Document Revised: 12/30/2011 Document Reviewed: 11/01/2013  ExitCare® Patient Information ©2015 [employer]. This information is not intended to replace advice given to you by your health care provider. Make sure you  discuss any questions you have with your health care provider.  Headaches, Frequently Asked Questions  MIGRAINE HEADACHES  Q: What is migraine? What causes it? How can I treat it?  A: Generally, migraine headaches begin as a dull ache. Then they develop into a constant, throbbing, and pulsating pain. You may experience pain at the temples. You may experience pain at the front or back of one or both sides of the head. The pain is usually accompanied by a combination of:  · Nausea.  · Vomiting.  · Sensitivity to light and noise.  Some people (about 15%) experience an aura (see below) before an attack. The cause of migraine is believed to be chemical reactions in the brain. Treatment for migraine may include over-the-counter or prescription medications. It may also include self-help techniques. These include relaxation training and biofeedback.   Q: What is an aura?  A: About 15% of people with migraine get an "aura". This is a sign of neurological symptoms that occur before a migraine headache. You may see wavy or jagged lines, dots, or flashing lights. You might experience tunnel vision or blind spots in one or both eyes. The aura can include visual or auditory hallucinations (something imagined). It may include disruptions in smell (such as strange odors), taste or touch. Other symptoms include:  · Numbness.  · A "pins and needles" sensation.  · Difficulty in recalling or speaking the correct word.  These neurological events may last as long as 60 minutes. These symptoms will fade as the headache begins.  Q: What is a trigger?  A: Certain physical or environmental factors can lead to or "trigger" a migraine. These include:  · Foods.  · Hormonal changes.  · Weather.  · Stress.  It is important to remember that triggers are different for everyone. To help prevent migraine attacks, you need to figure out which triggers affect you. Keep a headache diary. This is a good way to track triggers. The diary will help you talk  to your healthcare professional about your condition.  Q: Does weather affect migraines?  A: Bright sunshine, hot, humid conditions, and drastic changes in barometric pressure may lead to, or "trigger," a migraine attack in some people. But studies have shown that weather does not act as a trigger for everyone with migraines.  Q: What is the link between migraine and hormones?  A: Hormones start and regulate many of your body's functions. Hormones keep your body in balance within a constantly changing environment. The levels of hormones in your body are unbalanced at times. Examples are during menstruation, pregnancy, or menopause. That can lead to a migraine attack. In fact, about three quarters of all women with migraine report that their attacks are related to the menstrual cycle.     Q: Is there an increased risk of stroke for migraine sufferers?  A: The likelihood of a migraine attack causing a stroke is very remote. That is not to say that migraine sufferers cannot have a stroke associated with their migraines. In persons under age 40, the most common associated factor for stroke is migraine headache. But over the course of a person's normal life span, the occurrence of migraine headache may actually be associated with a reduced risk of dying from cerebrovascular disease due to stroke.   Q: What are acute medications for migraine?  A: Acute medications are used to treat the pain of the headache after it has started. Examples over-the-counter medications, NSAIDs, ergots, and triptans.   Q: What are the triptans?  A: Triptans are the newest class of abortive medications. They are specifically targeted to treat migraine. Triptans are vasoconstrictors. They moderate some chemical reactions in the brain. The triptans work on receptors in your brain. Triptans help to restore the balance of a neurotransmitter called serotonin. Fluctuations in levels of serotonin are thought to be a main cause of migraine.   Q: Are  over-the-counter medications for migraine effective?  A: Over-the-counter, or "OTC," medications may be effective in relieving mild to moderate pain and associated symptoms of migraine. But you should see your caregiver before beginning any treatment regimen for migraine.   Q: What are preventive medications for migraine?  A: Preventive medications for migraine are sometimes referred to as "prophylactic" treatments. They are used to reduce the frequency, severity, and length of migraine attacks. Examples of preventive medications include antiepileptic medications, antidepressants, beta-blockers, calcium channel blockers, and NSAIDs (nonsteroidal anti-inflammatory drugs).  Q: Why are anticonvulsants used to treat migraine?  A: During the past few years, there has been an increased interest in antiepileptic drugs for the prevention of migraine. They are sometimes referred to as "anticonvulsants". Both epilepsy and migraine may be caused by similar reactions in the brain.   Q: Why are antidepressants used to treat migraine?  A: Antidepressants are typically used to treat people with depression. They may reduce migraine frequency by regulating chemical levels, such as serotonin, in the brain.   Q: What alternative therapies are used to treat migraine?  A: The term "alternative therapies" is often used to describe treatments considered outside the scope of conventional Western medicine. Examples of alternative therapy include acupuncture, acupressure, and yoga. Another common alternative treatment is herbal therapy. Some herbs are believed to relieve headache pain. Always discuss alternative therapies with your caregiver before proceeding. Some herbal products contain arsenic and other toxins.  TENSION HEADACHES  Q: What is a tension-type headache? What causes it? How can I treat it?  A: Tension-type headaches occur randomly. They are often the result of temporary stress, anxiety, fatigue, or anger. Symptoms include  soreness in your temples, a tightening band-like sensation around your head (a "vice-like" ache). Symptoms can also include a pulling feeling, pressure sensations, and contracting head and neck muscles. The headache begins in your forehead, temples, or the back of your head and neck. Treatment for tension-type headache may include over-the-counter or prescription medications. Treatment may also include self-help techniques such as relaxation training and biofeedback.  CLUSTER HEADACHES  Q: What is a cluster headache? What causes it? How can I treat it?  A: Cluster headache gets its name because the attacks come in groups. The pain arrives with little, if any, warning. It is usually on one side of the head. A tearing or bloodshot eye and   a runny nose on the same side of the headache may also accompany the pain. Cluster headaches are believed to be caused by chemical reactions in the brain. They have been described as the most severe and intense of any headache type. Treatment for cluster headache includes prescription medication and oxygen.  SINUS HEADACHES  Q: What is a sinus headache? What causes it? How can I treat it?  A: When a cavity in the bones of the face and skull (a sinus) becomes inflamed, the inflammation will cause localized pain. This condition is usually the result of an allergic reaction, a tumor, or an infection. If your headache is caused by a sinus blockage, such as an infection, you will probably have a fever. An x-ray will confirm a sinus blockage. Your caregiver's treatment might include antibiotics for the infection, as well as antihistamines or decongestants.   REBOUND HEADACHES  Q: What is a rebound headache? What causes it? How can I treat it?  A: A pattern of taking acute headache medications too often can lead to a condition known as "rebound headache." A pattern of taking too much headache medication includes taking it more than 2 days per week or in excessive amounts. That means more  than the label or a caregiver advises. With rebound headaches, your medications not only stop relieving pain, they actually begin to cause headaches. Doctors treat rebound headache by tapering the medication that is being overused. Sometimes your caregiver will gradually substitute a different type of treatment or medication. Stopping may be a challenge. Regularly overusing a medication increases the potential for serious side effects. Consult a caregiver if you regularly use headache medications more than 2 days per week or more than the label advises.  ADDITIONAL QUESTIONS AND ANSWERS  Q: What is biofeedback?  A: Biofeedback is a self-help treatment. Biofeedback uses special equipment to monitor your body's involuntary physical responses. Biofeedback monitors:  · Breathing.  · Pulse.  · Heart rate.  · Temperature.  · Muscle tension.  · Brain activity.  Biofeedback helps you refine and perfect your relaxation exercises. You learn to control the physical responses that are related to stress. Once the technique has been mastered, you do not need the equipment any more.  Q: Are headaches hereditary?  A: Four out of five (80%) of people that suffer report a family history of migraine. Scientists are not sure if this is genetic or a family predisposition. Despite the uncertainty, a child has a 50% chance of having migraine if one parent suffers. The child has a 75% chance if both parents suffer.   Q: Can children get headaches?  A: By the time they reach high school, most young people have experienced some type of headache. Many safe and effective approaches or medications can prevent a headache from occurring or stop it after it has begun.   Q: What type of doctor should I see to diagnose and treat my headache?  A: Start with your primary caregiver. Discuss his or her experience and approach to headaches. Discuss methods of classification, diagnosis, and treatment. Your caregiver may decide to recommend you to a  headache specialist, depending upon your symptoms or other physical conditions. Having diabetes, allergies, etc., may require a more comprehensive and inclusive approach to your headache. The National Head[employer] Foundation will provide, upon request, a list of NHF physician members in your state.  Document Released: 09/20/2003 Document Revised: 09/22/2011 Document Reviewed: 02/28/2008  ExitCare® Patient Information ©2015 [employer]. This information is   not intended to replace advice given to you by your health care provider. Make sure you discuss any questions you have with your health care provider.

## 2015-01-26 NOTE — ED Notes (Addendum)
Pt alert and oriented x4. Respirations even and unlabored, bilateral symmetrical rise and fall of chest. Skin warm and dry. In no acute distress. Denies needs.  md at bedside 

## 2015-01-26 NOTE — ED Notes (Signed)
Per pt, states back pain radiating up to head causing migraine

## 2015-01-26 NOTE — ED Notes (Signed)
Pt escorted to discharge window. Pt verbalized understanding discharge instructions. In no acute distress.  

## 2015-01-26 NOTE — ED Provider Notes (Signed)
CSN: [id number]     Arrival date & time 01/26/15  0813 History   First MD Initiated Contact with Patient 01/26/15 [phone]     Chief Complaint  Patient presents with  . Back Pain     Patient is a 29 y.o. female presenting with back pain. The history is provided by the patient. No language interpreter was used.  Back Pain  Doris Guerrero presents for evaluation of back pain. She states that she has a history of recurrent back pain secondary to scoliosis. For the last several days she's had increased mid thoracic back pain that radiates up her back into her neck. She feels like she now has a migraine headache related to the back pain. She feels stiff throughout her back and shoulders. She denies any fevers, vomiting, numbness, weakness, abdominal pain. She has tried ibuprofen, Percocet, Flexeril, tizanidine without relief. Symptoms are moderate, constant, worsening.  Past Medical History  Diagnosis Date  . Anxiety   . Mental disorder     bipolar, on meds  . Scoliosis   . Chronic back pain    Past Surgical History  Procedure Laterality Date  . No past surgeries     No family history on file. History  Substance Use Topics  . Smoking status: Never Smoker   . Smokeless tobacco: Not on file  . Alcohol Use: No   OB History    Gravida Para Term Preterm AB TAB SAB Ectopic Multiple Living   0 0 0 0 0 0 0 0 0 0      Review of Systems  Musculoskeletal: Positive for back pain.  All other systems reviewed and are negative.     Allergies  Review of patient's allergies indicates no known allergies.  Home Medications   Prior to Admission medications   Medication Sig Start Date End Date Taking? Authorizing Provider  HYDROcodone-acetaminophen (NORCO/VICODIN) 5-325 MG per tablet Take 1 tablet by mouth every 4 (four) hours as needed for moderate pain or severe pain. 05/29/14   Trixie DredgeEmily West, PA-C  lidocaine (XYLOCAINE) 2 % solution Use as directed 10 mLs in the mouth or throat as needed for mouth  pain. Swish and spit PRN pain 05/29/14   Trixie DredgeEmily West, PA-C  methocarbamol (ROBAXIN) 500 MG tablet Take 1 tablet (500 mg total) by mouth 2 (two) times daily. 02/25/14   Heather Laisure, PA-C  naproxen (NAPROSYN) 500 MG tablet Take 1 tablet (500 mg total) by mouth 2 (two) times daily. 02/25/14   Heather Laisure, PA-C   BP 125/92 mmHg  Pulse 79  Temp(Src) 98.6 F (37 C) (Oral)  Resp 16  SpO2 100%  LMP 01/01/2015 Physical Exam  Constitutional: She is oriented to person, place, and time. She appears well-developed and well-nourished.  HENT:  Head: Normocephalic and atraumatic.  Cardiovascular: Normal rate and regular rhythm.   No murmur heard. Pulmonary/Chest: Effort normal and breath sounds normal. No respiratory distress.  Diffuse posterior thoracic tenderness to palpation  Abdominal: Soft. There is no tenderness. There is no rebound and no guarding.  Musculoskeletal: She exhibits no edema or tenderness.  Neurological: She is alert and oriented to person, place, and time.  5/5 strength in all 4 extremities, sensation light touch intact throughout all 4 extremities.  Skin: Skin is warm and dry.  Psychiatric: She has a normal mood and affect. Her behavior is normal.  Nursing note and vitals reviewed.   ED Course  Procedures (including critical care time) Labs Review Labs Reviewed  URINALYSIS, ROUTINE W REFLEX  MICROSCOPIC (NOT AT [facility]) - Abnormal; Notable for the following:    APPearance CLEAR (*)    Specific Gravity, Urine 1.033 (*)    All other components within normal limits  POC URINE PREG, ED    Imaging Review No results found.   EKG Interpretation None      MDM   Final diagnoses:  Bilateral thoracic back pain  Tension headache    Patient here for evaluation of thoracic back pain and headache. History and presentation is not consistent with epidural abscess, meningitis. Patient is improved on repeat evaluation in the department. I discussed home care for thoracic  back pain as well as tension headache as well as return precautions.    Tilden Fossa, MD 01/26/15 [phone]

## 2015-01-26 NOTE — ED Notes (Signed)
md at bedside

## 2015-02-22 ENCOUNTER — Ambulatory Visit: Payer: Medicaid Other | Admitting: Internal Medicine

## 2015-04-08 ENCOUNTER — Emergency Department (HOSPITAL_COMMUNITY)
Admission: EM | Admit: 2015-04-08 | Discharge: 2015-04-08 | Disposition: A | Discharge status: Home / Self Care | Payer: Medicaid Other | Attending: Emergency Medicine | Admitting: Emergency Medicine

## 2015-04-08 ENCOUNTER — Encounter (HOSPITAL_COMMUNITY): Payer: Self-pay | Admitting: Emergency Medicine

## 2015-04-08 DIAGNOSIS — F99 Mental disorder, not otherwise specified: Secondary | ICD-10-CM | POA: Insufficient documentation

## 2015-04-08 DIAGNOSIS — M546 Pain in thoracic spine: Secondary | ICD-10-CM | POA: Insufficient documentation

## 2015-04-08 DIAGNOSIS — M419 Scoliosis, unspecified: Secondary | ICD-10-CM | POA: Insufficient documentation

## 2015-04-08 DIAGNOSIS — F419 Anxiety disorder, unspecified: Secondary | ICD-10-CM | POA: Diagnosis not present

## 2015-04-08 DIAGNOSIS — M549 Dorsalgia, unspecified: Secondary | ICD-10-CM

## 2015-04-08 DIAGNOSIS — G8929 Other chronic pain: Secondary | ICD-10-CM | POA: Diagnosis not present

## 2015-04-08 MED ORDER — METHOCARBAMOL 500 MG PO TABS: 500.0000 mg | ORAL_TABLET | Freq: Three times a day (TID) | ORAL | Status: DC | PRN

## 2015-04-08 MED ORDER — DIAZEPAM 5 MG PO TABS
5.0000 mg | ORAL_TABLET | Freq: Once | ORAL | Status: AC
  Administered 2015-04-08: 5 mg via ORAL
  Filled 2015-04-08: qty 1

## 2015-04-08 MED ORDER — TRAMADOL HCL 50 MG PO TABS: 50.0000 mg | ORAL_TABLET | Freq: Four times a day (QID) | ORAL | Status: DC | PRN

## 2015-04-08 MED ORDER — HYDROCODONE-ACETAMINOPHEN 5-325 MG PO TABS
2.0000 | ORAL_TABLET | Freq: Once | ORAL | Status: AC
  Administered 2015-04-08: 2 via ORAL
  Filled 2015-04-08: qty 2

## 2015-04-08 NOTE — Discharge Instructions (Signed)
Take robaxin as prescribed for muscle spasm. No driving or operating heavy machinery while taking this drug as it may cause drowsiness. Take tramadol as directed for pain. Rest, avoid heavy lifting or hard physical activity for the next few days. Follow-up with your primary care physician next month as scheduled.  Chronic Back Pain  When back pain lasts longer than 3 months, it is called chronic back pain.People with chronic back pain often go through certain periods that are more intense (flare-ups).  CAUSES Chronic back pain can be caused by wear and tear (degeneration) on different structures in your back. These structures include:  The bones of your spine (vertebrae) and the joints surrounding your spinal cord and nerve roots (facets).  The strong, fibrous tissues that connect your vertebrae (ligaments). Degeneration of these structures may result in pressure on your nerves. This can lead to constant pain. HOME CARE INSTRUCTIONS  Avoid bending, heavy lifting, prolonged sitting, and activities which make the problem worse.  Take brief periods of rest throughout the day to reduce your pain. Lying down or standing usually is better than sitting while you are resting.  Take over-the-counter or prescription medicines only as directed by your caregiver. SEEK IMMEDIATE MEDICAL CARE IF:   You have weakness or numbness in one of your legs or feet.  You have trouble controlling your bladder or bowels.  You have nausea, vomiting, abdominal pain, shortness of breath, or fainting. Document Released: 08/07/2004 Document Revised: 09/22/2011 Document Reviewed: 06/14/2011 [facility] Patient Information 2015 [place], [place]. This information is not intended to replace advice given to you by your health care provider. Make sure you discuss any questions you have with your health care provider.

## 2015-04-08 NOTE — ED Notes (Signed)
Pt sts that she has a hx of back pain with HA from scoliosis. Pt denies injury or fall. Pt sts that this happens frequently. Pt has no other c/o. Pt is A&O and in NAD

## 2015-04-08 NOTE — ED Provider Notes (Signed)
CSN: [id number]     Arrival date & time 04/08/15  1126 History   First MD Initiated Contact with Patient 04/08/15 1128     Chief Complaint  Patient presents with  . Back Pain     (Consider location/radiation/quality/duration/timing/severity/associated sxs/prior Treatment) HPI Comments: 29 year old female with a past medical history of chronic back pain, scoliosis, bipolar disorder and anxiety presenting with an exacerbation of chronic back pain. States the pain occurs frequently but she had an issue with her insurance and has not been able to see a PCP. Finally was able to make an appointment to be seen next month. No known injury or trauma. Pain is in mid back and nonradiating, relieved by her friends and mother's tizanidine, unrelieved by heating pad and ibuprofen.  Patient is a 29 y.o. female presenting with back pain. The history is provided by the patient.  Back Pain Location:  Thoracic spine Quality: sharp and aching. Radiates to:  Does not radiate Pain severity:  Severe Pain is:  Same all the time Onset quality:  Gradual Duration:  4 days Timing:  Constant Progression:  Worsening Chronicity:  Chronic Context: not falling, not lifting heavy objects, not MVA and not recent injury   Relieved by:  Muscle relaxants Worsened by:  Bending and movement Ineffective treatments:  Ibuprofen and heating pad Risk factors: no hx of cancer, not obese and no recent surgery     Past Medical History  Diagnosis Date  . Anxiety   . Mental disorder     bipolar, on meds  . Scoliosis   . Chronic back pain    Past Surgical History  Procedure Laterality Date  . No past surgeries     No family history on file. Social History  Substance Use Topics  . Smoking status: Never Smoker   . Smokeless tobacco: None  . Alcohol Use: No   OB History    Gravida Para Term Preterm AB TAB SAB Ectopic Multiple Living       Review of Systems  Musculoskeletal: Positive for back  pain.  All other systems reviewed and are negative.     Allergies  Review of patient's allergies indicates no known allergies.  Home Medications   Prior to Admission medications   Medication Sig Start Date End Date Taking? Authorizing Provider  HYDROcodone-acetaminophen (NORCO/VICODIN) 5-325 MG per tablet Take 1 tablet by mouth every 6 (six) hours as needed. 01/26/15   Tilden Fossa, MD  methocarbamol (ROBAXIN) 500 MG tablet Take 1 tablet (500 mg total) by mouth every 8 (eight) hours as needed for muscle spasms. 04/08/15   Kathrynn Speed, PA-C  traMADol (ULTRAM) 50 MG tablet Take 1 tablet (50 mg total) by mouth every 6 (six) hours as needed. 04/08/15   Robyn M Hess, PA-C   BP 111/71 mmHg  Pulse 74  Temp(Src) 98.4 F (36.9 C) (Oral)  Resp 18  SpO2 100%  LMP 03/30/2015 (Approximate) Physical Exam  Constitutional: She is oriented to person, place, and time. She appears well-developed and well-nourished. No distress.  HENT:  Head: Normocephalic and atraumatic.  Mouth/Throat: Oropharynx is clear and moist.  Eyes: Conjunctivae are normal.  Neck: Normal range of motion. Neck supple. No spinous process tenderness and no muscular tenderness present.  Cardiovascular: Normal rate, regular rhythm and normal heart sounds.   Pulmonary/Chest: Effort normal and breath sounds normal. No respiratory distress.  Musculoskeletal: Normal range of motion. She exhibits no edema.  TTP  over thoracic paraspinal muscles. No spinous process tenderness. No edema or step-off.  Neurological: She is alert and oriented to person, place, and time. She has normal strength.  Strength lower extremities 5/5 and equal bilateral. Sensation intact. Normal gait.  Skin: Skin is warm and dry. No rash noted. She is not diaphoretic.  Psychiatric: She has a normal mood and affect. Her behavior is normal.  Nursing note and vitals reviewed.   ED Course  Procedures (including critical care time) Labs Review Labs Reviewed -  No data to display  Imaging Review No results found. I have personally reviewed and evaluated these images and lab results as part of my medical decision-making.   EKG Interpretation None      MDM   Final diagnoses:  Chronic back pain   Exacerbation of chronic back pain, no injury or trauma. No red flags concerning patient's back pain. No s/s of central cord compression or cauda equina. Lower extremities are neurovascularly intact and patient is ambulating without difficulty. Imaging not warranted at this time. Patient requesting a prescription for hydrocodone "like I get every time I come to [facility]". I discussed with her that this is ongoing chronic pain and narcotic prescriptions are not appropriate and this should be managed by PCP. Discussed with her that I could treat her pain here in the ED and discharge her home with either NSAIDs or tramadol. Patient became very irate and upset and requested to see supervising physician. Patient seen by Dr. Freida Busman, who after discussion with patient and agrees with no narcotic prescription, will treat pain in the ED and discharged home with Robaxin and tramadol. Stable for discharge. Return precautions given.  Kathrynn Speed, PA-C 04/08/15 1159  Lorre Nick, MD 04/10/15 1242

## 2015-04-08 NOTE — ED Notes (Signed)
Pt upset that she is not getting a rx for pain to take home. Pt requests to see attending physician. Dr Freida Busman and charge RN Patty at bedside

## 2015-05-04 ENCOUNTER — Ambulatory Visit (INDEPENDENT_AMBULATORY_CARE_PROVIDER_SITE_OTHER): Payer: Medicaid Other | Admitting: Internal Medicine

## 2015-05-04 ENCOUNTER — Encounter: Payer: Self-pay | Admitting: Internal Medicine

## 2015-05-04 VITALS — BP 108/78 | HR 89 | Temp 98.2°F | Ht 61.0 in | Wt 92.5 lb

## 2015-05-04 DIAGNOSIS — M549 Dorsalgia, unspecified: Secondary | ICD-10-CM

## 2015-05-04 DIAGNOSIS — R202 Paresthesia of skin: Secondary | ICD-10-CM

## 2015-05-04 DIAGNOSIS — G8929 Other chronic pain: Secondary | ICD-10-CM

## 2015-05-04 DIAGNOSIS — M545 Low back pain: Secondary | ICD-10-CM

## 2015-05-04 MED ORDER — NAPROXEN 375 MG PO TABS: 375.0000 mg | ORAL_TABLET | Freq: Two times a day (BID) | ORAL | Status: DC

## 2015-05-04 NOTE — Assessment & Plan Note (Addendum)
Occurring only during days 1-3 of menstrual cycle. As patient does not have accompanying weakness or numbness, and this does not hinder her daily activities, no further work-up warranted at this time.   - Discussed possibility of nerve compression due to edema during menstrual cycle - Encouraged participation in physical therapy in hopes that this might resolve leg issues as well

## 2015-05-04 NOTE — Progress Notes (Signed)
Subjective:    Patient ID: Doris Guerrero, female    DOB: [birth year], 29 y.o.   MRN: [id number]  HPI  Doris Guerrero is a 29 yo F with PMH of scoliosis who presents with chronic low back pain.   Shamaria reports that two years ago she was going to physical therapy for back pain due to scoliosis, however she lost her Medicaid and was unable to continue treatment. Since then, she has been going to the ED intermittently for treatment of severe lower back pain. She has Medicaid again, so she came to [place] for continued care.   She reports that the pain is located mostly in the lumbosacral region bilaterally. It is improved by heating pads as well as muscle relaxants and Vicodin, which she has been prescribed in the past. She reports that she has tried ibuprofen and Tylenol with no symptomatic relief. Her symptoms did improve when she was in physical therapy. The pain is worsened by sitting up straight or standing for an extended period of time. She said recently the pain has been so severe that it has caused a HA or even nausea. She has also been waking up in the middle of the night due to pain. Recently the pain has been so severe that she has not been able to perform her regular daily activities, including difficulties caring for her two toddlers. Denies numbness, weakness, bowel or bladder incontinence.   [name] also reports tingling in her L leg during her menstrual cycle. She reports that for the first three days of her cycle, she has intense tingling beginning behind her L knee and extending down her calf to her toes. She said sometimes the tingling is so severe that she is unable to walk. She has not found anything that alleviates these symptoms. The tingling resolves spontaneously after day #3 of her cycle. She denies tingling elsewhere.   Review of Systems  Constitutional: Positive for activity change.  Respiratory: Negative for chest tightness and shortness of breath.   Cardiovascular: Negative  for leg swelling.  Genitourinary:       Denies bowel or bladder incontinence  Musculoskeletal: Positive for back pain. Negative for gait problem and neck pain.  Neurological: Positive for headaches. Negative for weakness and numbness.       Tingling of L lower leg and foot       Objective:   Physical Exam  Constitutional: She is oriented to person, place, and time. She appears well-developed and well-nourished. No distress.  HENT:  Head: Normocephalic and atraumatic.  Cardiovascular: Normal rate, regular rhythm and normal heart sounds.   No murmur heard. Pulmonary/Chest: Effort normal and breath sounds normal. No respiratory distress. She has no wheezes.  Abdominal: Soft. Bowel sounds are normal. She exhibits no distension. There is no tenderness.  Musculoskeletal: Normal range of motion. She exhibits no edema.       Back:  No hesitation to perform or pain with ROM exam.  Neurological: She is alert and oriented to person, place, and time. No cranial nerve deficit.  Skin: Skin is warm and dry.  Psychiatric: Her behavior is normal.  Flat affect       Assessment & Plan:  Doris Guerrero is a 29 yo F with PMH of scoliosis presenting with chronic low back pain and tingling of left lower leg.   Chronic back pain Ongoing problem that patient attributes to scoliosis. Is worsened recently since she has been unable to continue physical therapy. Given no physical  abnormalities seen on exam and lack of neurological exams, no need for further work-up at this time.   - Referral to physical therapy - Naprosyn BID PRN - Provided patient with education regarding chronic low back pain as well as exercises to do at home   Tingling in left lower leg Occurring only during days 1-3 of menstrual cycle. As patient does not have accompanying weakness or numbness, and this does not hinder her daily activities, no further work-up warranted at this time.   - Discussed possibility of nerve compression  due to edema during menstrual cycle - Encouraged participation in physical therapy in hopes that this might resolve leg issues as well

## 2015-05-04 NOTE — Assessment & Plan Note (Signed)
Ongoing problem that patient attributes to scoliosis. Is worsened recently since she has been unable to continue physical therapy. Given no physical abnormalities seen on exam and lack of neurological exams, no need for further work-up at this time.   - Referral to physical therapy - Naprosyn BID PRN - Provided patient with education regarding chronic low back pain as well as exercises to do at home

## 2015-05-04 NOTE — Patient Instructions (Addendum)
It was nice meeting you today!  For your back pain, I have prescribed Naprosyn. This is a prescription medication that you can take two times a day with a meal. I have also referred you to physical therapy. In the meantime, you can do the back exercises listed below.   If you have any questions, please feel free to contact the clinic.   Be well, Dr. Natale MilchLancaster   Back Exercises If you have pain in your back, do these exercises 2-3 times each day or as told by your doctor. When the pain goes away, do the exercises once each day, but repeat the steps more times for each exercise (do more repetitions). If you do not have pain in your back, do these exercises once each day or as told by your doctor. EXERCISES Single Knee to Chest Do these steps 3-5 times in a row for each leg: 1. Lie on your back on a firm bed or the floor with your legs stretched out. 2. Bring one knee to your chest. 3. Hold your knee to your chest by grabbing your knee or thigh. 4. Pull on your knee until you feel a gentle stretch in your lower back. 5. Keep doing the stretch for 10-30 seconds. 6. Slowly let go of your leg and straighten it. Pelvic Tilt Do these steps 5-10 times in a row: 1. Lie on your back on a firm bed or the floor with your legs stretched out. 2. Bend your knees so they point up to the ceiling. Your feet should be flat on the floor. 3. Tighten your lower belly (abdomen) muscles to press your lower back against the floor. This will make your tailbone point up to the ceiling instead of pointing down to your feet or the floor. 4. Stay in this position for 5-10 seconds while you gently tighten your muscles and breathe evenly. Cat-Cow Do these steps until your lower back bends more easily: 1. Get on your hands and knees on a firm surface. Keep your hands under your shoulders, and keep your knees under your hips. You may put padding under your knees. 2. Let your head hang down, and make your tailbone point  down to the floor so your lower back is round like the back of a cat. 3. Stay in this position for 5 seconds. 4. Slowly lift your head and make your tailbone point up to the ceiling so your back hangs low (sags) like the back of a cow. 5. Stay in this position for 5 seconds. Press-Ups Do these steps 5-10 times in a row: 1. Lie on your belly (face-down) on the floor. 2. Place your hands near your head, about shoulder-width apart. 3. While you keep your back relaxed and keep your hips on the floor, slowly straighten your arms to raise the top half of your body and lift your shoulders. Do not use your back muscles. To make yourself more comfortable, you may change where you place your hands. 4. Stay in this position for 5 seconds. 5. Slowly return to lying flat on the floor. Bridges Do these steps 10 times in a row: 1. Lie on your back on a firm surface. 2. Bend your knees so they point up to the ceiling. Your feet should be flat on the floor. 3. Tighten your butt muscles and lift your butt off of the floor until your waist is almost as high as your knees. If you do not feel the muscles working in your butt and  the back of your thighs, slide your feet 1-2 inches farther away from your butt. 4. Stay in this position for 3-5 seconds. 5. Slowly lower your butt to the floor, and let your butt muscles relax. If this exercise is too easy, try doing it with your arms crossed over your chest. Belly Crunches Do these steps 5-10 times in a row: 1. Lie on your back on a firm bed or the floor with your legs stretched out. 2. Bend your knees so they point up to the ceiling. Your feet should be flat on the floor. 3. Cross your arms over your chest. 4. Tip your chin a little bit toward your chest but do not bend your neck. 5. Tighten your belly muscles and slowly raise your chest just enough to lift your shoulder blades a tiny bit off of the floor. 6. Slowly lower your chest and your head to the floor. Back  Lifts Do these steps 5-10 times in a row: 1. Lie on your belly (face-down) with your arms at your sides, and rest your forehead on the floor. 2. Tighten the muscles in your legs and your butt. 3. Slowly lift your chest off of the floor while you keep your hips on the floor. Keep the back of your head in line with the curve in your back. Look at the floor while you do this. 4. Stay in this position for 3-5 seconds. 5. Slowly lower your chest and your face to the floor. GET HELP IF:  Your back pain gets a lot worse when you do an exercise.  Your back pain does not lessen 2 hours after you exercise. If you have any of these problems, stop doing the exercises. Do not do them again unless your doctor says it is okay. GET HELP RIGHT AWAY IF:  You have sudden, very bad back pain. If this happens, stop doing the exercises. Do not do them again unless your doctor says it is okay.   This information is not intended to replace advice given to you by your health care provider. Make sure you discuss any questions you have with your health care provider.   Document Released: 08/02/2010 Document Revised: 03/21/2015 Document Reviewed: 08/24/2014 Elsevier Interactive Patient Education 2016 Elsevier Inc. Back Pain, Adult Back pain is very common. The pain often gets better over time. The cause of back pain is usually not dangerous. Most people can learn to manage their back pain on their own.  HOME CARE  Watch your back pain for any changes. The following actions may help to lessen any pain you are feeling: 7. Stay active. Start with short walks on flat ground if you can. Try to walk farther each day. 8. Exercise regularly as told by your doctor. Exercise helps your back heal faster. It also helps avoid future injury by keeping your muscles strong and flexible. 9. Do not sit, drive, or stand in one place for more than 30 minutes. 10. Do not stay in bed. Resting more than 1-2 days can slow down your  recovery. 11. Be careful when you bend or lift an object. Use good form when lifting: 1. Bend at your knees. 2. Keep the object close to your body. 3. Do not twist. 12. Sleep on a firm mattress. Lie on your side, and bend your knees. If you lie on your back, put a pillow under your knees. 13. Take medicines only as told by your doctor. 14. Put ice on the injured area. 1. Put  ice in a plastic bag. 2. Place a towel between your skin and the bag. 3. Leave the ice on for 20 minutes, 2-3 times a day for the first 2-3 days. After that, you can switch between ice and heat packs. 15. Avoid feeling anxious or stressed. Find good ways to deal with stress, such as exercise. 16. Maintain a healthy weight. Extra weight puts stress on your back. GET HELP IF:  5. You have pain that does not go away with rest or medicine. 6. You have worsening pain that goes down into your legs or buttocks. 7. You have pain that does not get better in one week. 8. You have pain at night. 9. You lose weight. 10. You have a fever or chills. GET HELP RIGHT AWAY IF:  6. You cannot control when you poop (bowel movement) or pee (urinate). 7. Your arms or legs feel weak. 8. Your arms or legs lose feeling (numbness). 9. You feel sick to your stomach (nauseous) or throw up (vomit). 10. You have belly (abdominal) pain. 11. You feel like you may pass out (faint).   This information is not intended to replace advice given to you by your health care provider. Make sure you discuss any questions you have with your health care provider.   Document Released: 12/17/2007 Document Revised: 07/21/2014 Document Reviewed: 11/01/2013 Elsevier Interactive Patient Education [employer]

## 2015-05-18 ENCOUNTER — Ambulatory Visit: Payer: Medicaid Other | Attending: Family Medicine | Admitting: Physical Therapy

## 2015-05-18 DIAGNOSIS — M545 Low back pain, unspecified: Secondary | ICD-10-CM

## 2015-05-18 NOTE — Therapy (Signed)
[facility] [address] [place], [place], [address] Phone: [phone]   Fax:  [phone]  Physical Therapy Note  Patient Details  Name: Doris Guerrero MRN: [id number] Date of Birth: 1986/07/09 Referring Provider: Brayton CavesAbigail Lancaster, MD/Kyle Randolm IdolFletke, MD  Encounter Date: 05/18/2015    Past Medical History  Diagnosis Date  . Anxiety   . Mental disorder     bipolar, on meds  . Scoliosis   . Chronic back pain     Past Surgical History  Procedure Laterality Date  . No past surgeries      There were no vitals filed for this visit.  Visit Diagnosis:  Midline low back pain without sciatica      Subjective Assessment - 05/18/15 1102    Subjective Pt is a 29 y/o female who presents to [facility]PPT for scoliosis and LBP.  Pt with long standing history of LBP and came to OPPT at this location 2-3 years ago.  Pt reports increased pain over 2 years and c/o stiffness and needing to stay in bed.     Pertinent History scoliosis   Limitations Sitting;Walking;Standing   How long can you sit comfortably? 10 min   How long can you stand comfortably? 5-10 min   How long can you walk comfortably? 5-10 min   Patient Stated Goals improve pain; care for children   Currently in Pain? Yes   Pain Score 6    Pain Location Back   Pain Orientation Left;Right   Pain Descriptors / Indicators Aching;Sharp  stiffness; back crunchy   Pain Type Chronic pain   Pain Frequency Constant   Aggravating Factors  standing, walking, sitting, lifting, bending   Pain Relieving Factors heat, stretches, hot shower, muscle relaxor            [facility] PT Assessment - 05/18/15 1108    Assessment   Medical Diagnosis LBP   Referring Provider Brayton CavesAbigail Lancaster, MD/Kyle Randolm IdolFletke, MD   Onset Date/Surgical Date --  15 years   Next MD Visit PRN; pt states she will call when she leaves   Prior Therapy 2-3 years ago for same problem   Precautions   Precaution Comments MD  advised occupation should be sitting   Balance Screen   Has the patient fallen in the past 6 months No   Home Environment   Additional Comments lives on 2nd floor apt; pt states she has to sit on steps to rest and has hill to negotiate   Prior Function   Level of Independence Independent   Warden/rangerVocation Student   Vocation Requirements associates degree in early childhood education; wants to open a daycare   Leisure n/a   Cognition   Overall Cognitive Status Within Functional Limits for tasks assessed        Pt arrived for initial PT evaluation.  Evaluation initiated and subjective history obtained.  Reviewed with pt that insurance only covers the evaluation and would not approve additional visits as she didn't have a qualifying diagnosis, and pt unable to proceed as self-pay.  Pt verbalized understanding.    Upon discussing plan for objective measures and to provide exercises based on clinical findings, pt stated "this is what I told the doctor, how am I supposed to do exercises when I can't even get out of bed."  Pt then requested to leave as she didn't feel she could tolerate exercises and didn't want to continue with evaluation.  Educated that bed level exercises could be performed and provided  to pt, however pt declined to participate any further.  Pt stated she planned to call MD's office when she left here.  Per pt's request, initial evaluation was not completed and advised her that referral is good for up to 6 months and to call if she would like to return.  Pt verbalized understanding and thanked PT when leaving.                                  Problem List Patient Active Problem List   Diagnosis Date Noted  . Tingling in left lower leg 05/04/2015  . Scoliosis   . Chronic back pain    Clarita Crane, PT, DPT 05/18/2015 11:29 AM  [facility] [address] [place], [place], [address] Phone:  [phone]   Fax:  [phone]  Name: Doris Guerrero MRN: [id number] Date of Birth: 10-25-1985

## 2015-10-01 ENCOUNTER — Encounter: Payer: Self-pay | Admitting: Internal Medicine

## 2015-10-01 ENCOUNTER — Ambulatory Visit (INDEPENDENT_AMBULATORY_CARE_PROVIDER_SITE_OTHER): Payer: Medicaid Other | Admitting: Internal Medicine

## 2015-10-01 VITALS — BP 117/68 | HR 82 | Temp 98.5°F | Ht 61.0 in | Wt 94.3 lb

## 2015-10-01 DIAGNOSIS — M545 Low back pain: Secondary | ICD-10-CM | POA: Diagnosis present

## 2015-10-01 NOTE — Progress Notes (Signed)
   Subjective:    Patient ID: Doris Guerrero, female    DOB: 09-10-1985, 30 y.o.   MRN: [id number]  HPI  Patient presented to discuss depression and anxiety, as well as back pain.  Depression/anxiety Patient reports long history of depression and anxiety, but says her symptoms have worsened in the past couple weeks since learning that her uncle is very ill. She has been previously seen by Dr. August SaucerMurthy at [facility], as recently as two months ago. She has been taking Divalproex Sod ER 500 mg at bedtime and Seroquel XR 150 mg qd. This is typically prescribed by Dr. August SaucerMurthy, however she says she is out today.   Back pain Patient reports back pain reportedly due to scoliosis since she was 14. At last appointment, patient reported that her pain had responded well to physical therapy in the past, but she had to stop going because her insurance no longer covered her sessions. I referred her to physical therapy again, however she only went once, and left before the appointment ended. She was also prescribed naprosyn at last appointment, but says this does not help. She would like stronger medication today.   Patient became very agitated during the interview, primarily because she was not prescribed stronger pain medication at her last appointment. The clinic's narcotics policy was explained to the patient by myself and Dr. Leveda AnnaHensel, and patient grew more agitated, raising her voice and using profanity. Patient stated that since we refused to help her (by not prescribing opioids), she was leaving the clinic and would find another doctor.   Review of Systems Full ROS unable to be obtained as patient left abruptly.     Objective:   Physical Exam Unable to obtain.      Assessment & Plan:  Given patient's profane language and offensive statements to providers, she will be dismissed from the practice.   Tarri AbernethyAbigail J Amiere Cawley, MD PGY-1 [facility] Pager [phone]

## 2015-10-01 NOTE — Progress Notes (Addendum)
Patient ID: Doris Guerrero, female   DOB: December 21, 1985, 30 y.o.   MRN: [id number] Will [facility] whether formal dismissal is appropriate.  Complicated by the fact that her [facility].  To be clear, the patient did not threaten.  While she raised her voice, she did not yell.  She did not curse.  I would judge her comments about Dr. Natale MilchLancaster as rude, but fell short of the abusive standard.    That said, [facility], I think it best that she not see Dr. Natale MilchLancaster.  Best for the patient and best for Dr. Natale MilchLancaster.

## 2016-01-08 ENCOUNTER — Encounter (HOSPITAL_COMMUNITY): Payer: Self-pay

## 2016-01-08 ENCOUNTER — Emergency Department (HOSPITAL_COMMUNITY)
Admission: EM | Admit: 2016-01-08 | Discharge: 2016-01-08 | Disposition: A | Discharge status: Home / Self Care | Payer: Medicaid Other | Attending: Emergency Medicine | Admitting: Emergency Medicine

## 2016-01-08 ENCOUNTER — Emergency Department (HOSPITAL_COMMUNITY): Payer: Medicaid Other

## 2016-01-08 DIAGNOSIS — Y999 Unspecified external cause status: Secondary | ICD-10-CM | POA: Insufficient documentation

## 2016-01-08 DIAGNOSIS — Y939 Activity, unspecified: Secondary | ICD-10-CM | POA: Insufficient documentation

## 2016-01-08 DIAGNOSIS — Y92410 Unspecified street and highway as the place of occurrence of the external cause: Secondary | ICD-10-CM | POA: Diagnosis not present

## 2016-01-08 DIAGNOSIS — M419 Scoliosis, unspecified: Secondary | ICD-10-CM | POA: Diagnosis not present

## 2016-01-08 DIAGNOSIS — M25562 Pain in left knee: Secondary | ICD-10-CM | POA: Diagnosis present

## 2016-01-08 DIAGNOSIS — S8992XA Unspecified injury of left lower leg, initial encounter: Secondary | ICD-10-CM

## 2016-01-08 MED ORDER — IBUPROFEN 200 MG PO TABS
600.0000 mg | ORAL_TABLET | Freq: Once | ORAL | Status: AC
  Administered 2016-01-08: 600 mg via ORAL
  Filled 2016-01-08: qty 3

## 2016-01-08 MED ORDER — NAPROXEN 500 MG PO TABS: 500.0000 mg | ORAL_TABLET | Freq: Two times a day (BID) | ORAL | Status: DC
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
## 2016-01-08 NOTE — Discharge Instructions (Signed)
Your x-ray is normal today. Keep your knee elevated at home. Ice several times a day. Naprosyn for pain and inflammation. Crutches as needed. Keep knee immobilizer on until able to move better. Follow-up with orthopedics doctor if not improving.  Knee Sprain A knee sprain is a tear in one of the strong, fibrous tissues that connect the bones (ligaments) in your knee. The severity of the sprain depends on how much of the ligament is torn. The tear can be either partial or complete. CAUSES  Often, sprains are a result of a fall or injury. The force of the impact causes the fibers of your ligament to stretch too much. This excess tension causes the fibers of your ligament to tear. SIGNS AND SYMPTOMS  You may have some loss of motion in your knee. Other symptoms include:  Bruising.  Pain in the knee area.  Tenderness of the knee to the touch.  Swelling. DIAGNOSIS  To diagnose a knee sprain, your health care provider will physically examine your knee. Your health care provider may also suggest an X-ray exam of your knee to make sure no bones are broken. TREATMENT  If your ligament is only partially torn, treatment usually involves keeping the knee in a fixed position (immobilization) or bracing your knee for activities that require movement for several weeks. To do this, your health care provider will apply a bandage, cast, or splint to keep your knee from moving and to support your knee during movement until it heals. For a partially torn ligament, the healing process usually takes 4-6 weeks. If your ligament is completely torn, depending on which ligament it is, you may need surgery to reconnect the ligament to the bone or reconstruct it. After surgery, a cast or splint may be applied and will need to stay on your knee for 4-6 weeks while your ligament heals. HOME CARE INSTRUCTIONS  Keep your injured knee elevated to decrease swelling.  To ease pain and swelling, apply ice to the injured  area:  Put ice in a plastic bag.  Place a towel between your skin and the bag.  Leave the ice on for 20 minutes, 2-3 times a day.  Only take medicine for pain as directed by your health care provider.  Do not leave your knee unprotected until pain and stiffness go away (usually 4-6 weeks).  If you have a cast or splint, do not allow it to get wet. If you have been instructed not to remove it, cover it with a plastic bag when you shower or bathe. Do not swim.  Your health care provider may suggest exercises for you to do during your recovery to prevent or limit permanent weakness and stiffness. SEEK IMMEDIATE MEDICAL CARE IF:  Your cast or splint becomes damaged.  Your pain becomes worse.  You have significant pain, swelling, or numbness below the cast or splint. MAKE SURE YOU:  Understand these instructions.  Will watch your condition.  Will get help right away if you are not doing well or get worse.   This information is not intended to replace advice given to you by your health care provider. Make sure you discuss any questions you have with your health care provider.   Document Released: 06/30/2005 Document Revised: 07/21/2014 Document Reviewed: 02/09/2013 Elsevier Interactive Patient Education [employer]2016 Elsevier Inc.

## 2016-07-15 ENCOUNTER — Emergency Department (HOSPITAL_COMMUNITY)
Admission: EM | Admit: 2016-07-15 | Discharge: 2016-07-15 | Disposition: A | Discharge status: Home / Self Care | Payer: Medicaid Other | Attending: Emergency Medicine | Admitting: Emergency Medicine

## 2016-07-15 ENCOUNTER — Encounter (HOSPITAL_COMMUNITY): Payer: Self-pay | Admitting: Emergency Medicine

## 2016-07-15 DIAGNOSIS — K130 Diseases of lips: Secondary | ICD-10-CM | POA: Insufficient documentation

## 2016-07-15 DIAGNOSIS — R220 Localized swelling, mass and lump, head: Secondary | ICD-10-CM | POA: Diagnosis present

## 2016-07-15 DIAGNOSIS — L089 Local infection of the skin and subcutaneous tissue, unspecified: Secondary | ICD-10-CM

## 2016-07-15 MED ORDER — CEPHALEXIN 500 MG PO CAPS: 500.0000 mg | ORAL_CAPSULE | Freq: Four times a day (QID) | ORAL | 0 refills | Status: DC

## 2016-07-15 NOTE — ED Triage Notes (Signed)
Pt reports pain and tenderness in r/lower lip. Pt stated that a piece of her lip ring has in imbedded in her lip. Swelling of r/lip noted

## 2016-07-15 NOTE — Discharge Instructions (Signed)
Take the prescribed medication as directed.  Keep clean at home.  If you want to keep using peroxide, recommend to mix with warm water. Follow-up with oral surgeon if not improving. Return to the ED for new or worsening symptoms.

## 2016-07-15 NOTE — ED Provider Notes (Signed)
[facility]-EMERGENCY DEPT Provider Note    By signing my name below, I, Earmon Phoenix, attest that this documentation has been prepared under the direction and in the presence of Sharilyn Sites, PA-C. Electronically Signed: Earmon Phoenix, ED Scribe. 07/15/16. 2:11 PM.    History   Chief Complaint Chief Complaint  Patient presents with  . Oral Swelling    The history is provided by the patient and medical records. No language interpreter was used.    HPI Comments:  Doris Guerrero is a 31 y.o. female who presents to the Emergency Department complaining of worsening right sided bottom lip swelling.  She reports associated yellow, purulent drainage. She states part of her lip ring has become imbedded in the lip. She states she has had it for about one year. She has not taken anything for pain relief but has been cleaning the area with peroxide. Pt denies modifying factors. She denies fever, chills, nausea, vomiting. She does not have a PCP.   Past Medical History:  Diagnosis Date  . Anxiety   . Chronic back pain   . Mental disorder    bipolar, on meds  . Scoliosis     Patient Active Problem List   Diagnosis Date Noted  . Tingling in left lower leg 05/04/2015  . Scoliosis   . Chronic back pain     Past Surgical History:  Procedure Laterality Date  . NO PAST SURGERIES      OB History    Gravida Para Term Preterm AB Living   0 0 0 0 0 0   SAB TAB Ectopic Multiple Live Births   0 0 0 0         Home Medications    Prior to Admission medications   Medication Sig Start Date End Date Taking? Authorizing Provider  naproxen (NAPROSYN) 500 MG tablet Take 1 tablet (500 mg total) by mouth 2 (two) times daily. 01/08/16   Jaynie Crumble, PA-C    Family History No family history on file.  Social History Social History  Substance Use Topics  . Smoking status: Never Smoker  . Smokeless tobacco: Never Used  . Alcohol use No     Allergies   Patient has no known  allergies.   Review of Systems Review of Systems  Constitutional: Negative for chills and fever.  HENT: Positive for facial swelling.   Gastrointestinal: Negative for nausea and vomiting.  Skin: Negative for color change.  All other systems reviewed and are negative.    Physical Exam Updated Vital Signs BP 119/82 (BP Location: Left Arm)   Pulse 97   Temp 97.6 F (36.4 C) (Oral)   Resp 18   Wt 104 lb (47.2 kg)   SpO2 100%   BMI 19.65 kg/m   Physical Exam  Constitutional: She is oriented to person, place, and time. She appears well-developed and well-nourished.  HENT:  Head: Normocephalic and atraumatic.  Mouth/Throat: Oropharynx is clear and moist.  Lip ring of right lower lip in place; yellow colored crusting surrounding external component; mild swelling of inner portion of right lower lip; no significant fluctuance; no tongue swelling, no difficulty swallowing, normal phonation  Eyes: Conjunctivae and EOM are normal. Pupils are equal, round, and reactive to light.  Neck: Normal range of motion.  Cardiovascular: Normal rate, regular rhythm and normal heart sounds.   Pulmonary/Chest: Effort normal and breath sounds normal.  Abdominal: Soft. Bowel sounds are normal.  Musculoskeletal: Normal range of motion.  Neurological: She is alert and  oriented to person, place, and time.  Skin: Skin is warm and dry.  Psychiatric: She has a normal mood and affect.  Nursing note and vitals reviewed.    ED Treatments / Results  DIAGNOSTIC STUDIES: Oxygen Saturation is 100% on RA, normal by my interpretation.   COORDINATION OF CARE: 2:05 PM- Will prescribe antibiotics. Will give referral to oral surgeon for follow up. Return precautions discussed. Pt verbalizes understanding and agrees to plan.  Medications - No data to display  Labs (all labs ordered are listed, but only abnormal results are displayed) Labs Reviewed - No data to display  EKG  EKG Interpretation None        Radiology No results found.  Procedures Procedures (including critical care time)  Medications Ordered in ED Medications - No data to display   Initial Impression / Assessment and Plan / ED Course  I have reviewed the triage vital signs and the nursing notes.  Pertinent labs & imaging results that were available during my care of the patient were reviewed by me and considered in my medical decision making (see chart for details).  Clinical Course    31 year old female here with infected lip ring of right lower lip. There is some yellow colored crusting noted. There is no significant fluctuance of the inner lip but there is some swelling noted. There is no generalized oral swelling, handling secretions well. Will start on antibiotics for now.  She may need this removed in the future if not improving.  Given oral surgery follow-up if symptoms worsening.  Discussed plan with patient, she acknowledged understanding and agreed with plan of care.  Return precautions given for new or worsening symptoms.  I personally performed the services described in this documentation, which was scribed in my presence. The recorded information has been reviewed and is accurate.  Final Clinical Impressions(s) / ED Diagnoses   Final diagnoses:  Pierced lip infection    New Prescriptions New Prescriptions   CEPHALEXIN (KEFLEX) 500 MG CAPSULE    Take 1 capsule (500 mg total) by mouth 4 (four) times daily.     Garlon HatchetLisa M Kolbey Teichert, PA-C 07/15/16 1440    Raeford RazorStephen Kohut, MD 07/17/16 [phone]

## 2017-02-14 ENCOUNTER — Encounter (HOSPITAL_COMMUNITY): Payer: Self-pay | Admitting: *Deleted

## 2017-02-14 ENCOUNTER — Inpatient Hospital Stay (HOSPITAL_COMMUNITY)
Admission: AD | Admit: 2017-02-14 | Discharge: 2017-02-14 | Disposition: A | Discharge status: Home / Self Care | Payer: Medicaid Other | Source: Ambulatory Visit | Attending: Obstetrics and Gynecology | Admitting: Obstetrics and Gynecology

## 2017-02-14 DIAGNOSIS — Z3202 Encounter for pregnancy test, result negative: Secondary | ICD-10-CM | POA: Diagnosis not present

## 2017-02-14 DIAGNOSIS — R109 Unspecified abdominal pain: Secondary | ICD-10-CM | POA: Diagnosis not present

## 2017-02-14 DIAGNOSIS — Z3200 Encounter for pregnancy test, result unknown: Secondary | ICD-10-CM | POA: Diagnosis present

## 2017-02-14 LAB — URINALYSIS, ROUTINE W REFLEX MICROSCOPIC
BILIRUBIN URINE: NEGATIVE
Bacteria, UA: NONE SEEN
GLUCOSE, UA: NEGATIVE mg/dL
Hgb urine dipstick: NEGATIVE
Ketones, ur: 5 mg/dL — AB
NITRITE: NEGATIVE
Protein, ur: NEGATIVE mg/dL
SPECIFIC GRAVITY, URINE: 1.02 (ref 1.005–1.030)
pH: 7 (ref 5.0–8.0)

## 2017-02-14 LAB — POCT PREGNANCY, URINE: Preg Test, Ur: NEGATIVE

## 2017-02-14 NOTE — MAU Provider Note (Signed)
Ms. Doris Guerrero is a 31 y.o. G0P0000 who present to MAU today for pregnancy confirmation.  States she ordered semen online for an at home semination which she performed on 6/9. Went to her PCP 2 weeks later & had a negative blood pregnancy test. Present tonight for evaluation of pregnancy; has not taken HPT since PCP visit. Reports mild lower abdominal cramping & nausea.    BP 108/72 (BP Location: Right Arm)   Pulse 92   Temp 98.1 F (36.7 C) (Oral)   Resp 16   Ht 5' (1.524 m)   Wt 115 lb (52.2 kg)   LMP 12/09/2016   BMI 22.46 kg/m  CONSTITUTIONAL: Well-developed, well-nourished female in no acute distress.  CARDIOVASCULAR: Normal heart rate noted RESPIRATORY: Effort and breath sounds normal GASTROINTESTINAL:Soft, no distention noted.  No tenderness, rebound or guarding.  SKIN: Skin is warm and dry. No rash noted. Not diaphoretic. No erythema. No pallor. PSYCHIATRIC: Normal mood and affect. Normal behavior. Normal judgment and thought content.  MDM Medical screening exam complete Negative UPT  A:  1. Pregnancy examination or test, negative result    P: Discharge home Patient to f/u with her PCP regarding medication management (needs to d/c Depakote prior to pregnancy)  Judeth HornLawrence, Lenford Beddow, NP 02/14/2017 8:59 PM

## 2017-02-14 NOTE — Discharge Instructions (Signed)
Preparing for Pregnancy If you are considering becoming pregnant, make an appointment to see your regular health care provider to learn how to prepare for a safe and healthy pregnancy (preconception care). During a preconception care visit, your health care provider will:  Do a complete physical exam, including a Pap test.  Take a complete medical history.  Give you information, answer your questions, and help you resolve problems.  Preconception checklist Medical history  Tell your health care provider about any current or past medical conditions. Your pregnancy or your ability to become pregnant may be affected by chronic conditions, such as diabetes, chronic hypertension, and thyroid problems.  Include your family's medical history as well as your partner's medical history.  Tell your health care provider about any history of STIs (sexually transmitted infections).These can affect your pregnancy. In some cases, they can be passed to your baby. Discuss any concerns that you have about STIs.  If indicated, discuss the benefits of genetic testing. This testing will show whether there are any genetic conditions that may be passed from you or your partner to your baby.  Tell your health care provider about: ? Any problems you have had with conception or pregnancy. ? Any medicines you take. These include vitamins, herbal supplements, and over-the-counter medicines. ? Your history of immunizations. Discuss any vaccinations that you may need.  Diet  Ask your health care provider what to include in a healthy diet that has a balance of nutrients. This is especially important when you are pregnant or preparing to become pregnant.  Ask your health care provider to help you reach a healthy weight before pregnancy. ? If you are overweight, you may be at higher risk for certain complications, such as high blood pressure, diabetes, and preterm birth. ? If you are underweight, you are more likely  to have a baby who has a low birth weight.  Lifestyle, work, and home  Let your health care provider know: ? About any lifestyle habits that you have, such as alcohol use, drug use, or smoking. ? About recreational activities that may put you at risk during pregnancy, such as downhill skiing and certain exercise programs. ? Tell your health care provider about any international travel, especially any travel to places with an active Zika virus outbreak. ? About harmful substances that you may be exposed to at work or at home. These include chemicals, pesticides, radiation, or even litter boxes. ? If you do not feel safe at home.  Mental health  Tell your health care provider about: ? Any history of mental health conditions, including feelings of depression, sadness, or anxiety. ? Any medicines that you take for a mental health condition. These include herbs and supplements.  Home instructions to prepare for pregnancy Lifestyle  Eat a balanced diet. This includes fresh fruits and vegetables, whole grains, lean meats, low-fat dairy products, healthy fats, and foods that are high in fiber. Ask to meet with a nutritionist or registered dietitian for assistance with meal planning and goals.  Get regular exercise. Try to be active for at least 30 minutes a day on most days of the week. Ask your health care provider which activities are safe during pregnancy.  Do not use any products that contain nicotine or tobacco, such as cigarettes and e-cigarettes. If you need help quitting, ask your health care provider.  Do not drink alcohol.  Do not take illegal drugs.  Maintain a healthy weight. Ask your health care provider what weight range is   right for you.  General instructions  Keep an accurate record of your menstrual periods. This makes it easier for your health care provider to determine your baby's due date.  Begin taking prenatal vitamins and folic acid supplements daily as directed by  your health care provider.  Manage any chronic conditions, such as high blood pressure and diabetes, as told by your health care provider. This is important.  How do I know that I am pregnant? You may be pregnant if you have been sexually active and you miss your period. Symptoms of early pregnancy include:  Mild cramping.  Very light vaginal bleeding (spotting).  Feeling unusually tired.  Nausea and vomiting (morning sickness).  If you have any of these symptoms and you suspect that you might be pregnant, you can take a home pregnancy test. These tests check for a hormone in your urine (human chorionic gonadotropin, or hCG). A woman's body begins to make this hormone during early pregnancy. These tests are very accurate. Wait until at least the first day after you miss your period to take one. If the test shows that you are pregnant (you get a positive result), call your health care provider to make an appointment for prenatal care. What should I do if I become pregnant?  Make an appointment with your health care provider as soon as you suspect you are pregnant.  Do not use any products that contain nicotine, such as cigarettes, chewing tobacco, and e-cigarettes. If you need help quitting, ask your health care provider.  Do not drink alcoholic beverages. Alcohol is related to a number of birth defects.  Avoid toxic odors and chemicals.  You may continue to have sexual intercourse if it does not cause pain or other problems, such as vaginal bleeding. This information is not intended to replace advice given to you by your health care provider. Make sure you discuss any questions you have with your health care provider. Document Released: 06/12/2008 Document Revised: 02/26/2016 Document Reviewed: 01/20/2016 Elsevier Interactive Patient Education  2017 Elsevier Inc.  

## 2017-02-14 NOTE — MAU Note (Addendum)
N/v and having some abd pain.. Today having some cramping in lower abd. Did home insemination on 12/20/16. LMP 12/09/16. No vag bleeding or d/c. Saw PCP 2wks after 6/9 and blood test negative for pregnancy

## 2017-06-10 ENCOUNTER — Encounter (HOSPITAL_COMMUNITY): Payer: Self-pay | Admitting: Family Medicine

## 2017-06-10 ENCOUNTER — Ambulatory Visit (HOSPITAL_COMMUNITY)
Admission: EM | Admit: 2017-06-10 | Discharge: 2017-06-10 | Disposition: A | Discharge status: Home / Self Care | Payer: Medicaid Other | Attending: Family Medicine | Admitting: Family Medicine

## 2017-06-10 DIAGNOSIS — M546 Pain in thoracic spine: Secondary | ICD-10-CM | POA: Diagnosis not present

## 2017-06-10 MED ORDER — TRAMADOL HCL 50 MG PO TABS: 50.0000 mg | ORAL_TABLET | Freq: Four times a day (QID) | ORAL | 0 refills | Status: AC | PRN

## 2017-06-10 NOTE — ED Triage Notes (Signed)
Pt presents with mid to upper back pain - states she has scoliosis and has flare ups from time to time, in which she takes Flexeril for. Pt states the muscle relaxer hasn't done anything today. Denies numbness/tingling, no dysuria. Ambulatory with steady gait.

## 2017-06-10 NOTE — ED Notes (Signed)
Patient discharged by provider. Ambulatory with steady gait.

## 2017-06-17 NOTE — ED Provider Notes (Signed)
  [facility]   [id number] 06/10/17 Arrival Time: 1851  ASSESSMENT & PLAN:  1. Acute bilateral thoracic back pain     Meds ordered this encounter  Medications  . traMADol (ULTRAM) 50 MG tablet    Sig: Take 1 tablet (50 mg total) by mouth every 6 (six) hours as needed.    Dispense:  12 tablet    Refill:  0   OTC ibuprofen with food. Ensure ROM. Will f/u in 48-72 hours if not seeing improvement. Reviewed expectations re: course of current medical issues. Questions answered. Outlined signs and symptoms indicating need for more acute intervention. Patient verbalized understanding. After Visit Summary given.   SUBJECTIVE:  Doris Guerrero is a 31 y.o. female who reports:  Back Pain: Patient complains of thoracic spine pain which is described as dull and throbbing. Reports h/o scoliosis and gets flare-ups from time to time. Usually muscle relaxer helps but took today without much relief. No injury/trauma reported. No extremity sensation changes or weakness. Certain movements make discomfort worse. Tylenol with mild help. Normal bowel/bladder habits. Ambulatory without difficulty. These symptoms are consistent with her normal exacerbations.  ROS: As per HPI.   OBJECTIVE:  Vitals:   06/10/17 1909 06/10/17 1910  BP: 133/84   Pulse: 96   Resp: 16   Temp: 98.2 F (36.8 C)   TempSrc: Oral   SpO2: 100%   Weight:  124 lb (56.2 kg)  Height:  5' (1.524 m)    General appearance: alert; no distress Extremities: no cyanosis or edema; symmetrical with no gross deformities CV: normal extremity capillary refill Back: generalized and poorly localized muscular tenderness over mid-back; scoliosis Skin: warm and dry Neurologic: normal gait; normal symmetric reflexes in all extremities; normal sensation Psychological: alert and cooperative; normal mood and affect  No Known Allergies  Past Medical History:  Diagnosis Date  . Anxiety   . Chronic back pain   . Mental disorder     bipolar, on meds  . Scoliosis    Social History   Socioeconomic History  . Marital status: Divorced    Spouse name: Not on file  . Number of children: Not on file  . Years of education: Not on file  . Highest education level: Not on file  Social Needs  . Financial resource strain: Not on file  . Food insecurity - worry: Not on file  . Food insecurity - inability: Not on file  . Transportation needs - medical: Not on file  . Transportation needs - non-medical: Not on file  Occupational History  . Not on file  Tobacco Use  . Smoking status: Never Smoker  . Smokeless tobacco: Never Used  Substance and Sexual Activity  . Alcohol use: No  . Drug use: No  . Sexual activity: Yes    Birth control/protection: None  Other Topics Concern  . Not on file  Social History Narrative  . Not on file    Past Surgical History:  Procedure Laterality Date  . NO PAST SURGERIES       Mardella LaymanHagler, Orrie Schubert, MD 06/17/17 [id number]

## 2017-07-26 ENCOUNTER — Emergency Department (HOSPITAL_COMMUNITY)
Admission: EM | Admit: 2017-07-26 | Discharge: 2017-07-26 | Disposition: A | Discharge status: Home / Self Care | Payer: Medicaid Other | Attending: Emergency Medicine | Admitting: Emergency Medicine

## 2017-07-26 ENCOUNTER — Encounter (HOSPITAL_COMMUNITY): Payer: Self-pay | Admitting: *Deleted

## 2017-07-26 ENCOUNTER — Other Ambulatory Visit: Payer: Self-pay

## 2017-07-26 DIAGNOSIS — M545 Low back pain: Secondary | ICD-10-CM | POA: Diagnosis present

## 2017-07-26 DIAGNOSIS — M79605 Pain in left leg: Secondary | ICD-10-CM | POA: Diagnosis not present

## 2017-07-26 DIAGNOSIS — M79604 Pain in right leg: Secondary | ICD-10-CM

## 2017-07-26 DIAGNOSIS — G8929 Other chronic pain: Secondary | ICD-10-CM | POA: Insufficient documentation

## 2017-07-26 MED ORDER — TRAMADOL HCL 50 MG PO TABS: 50.0000 mg | ORAL_TABLET | Freq: Four times a day (QID) | ORAL | 0 refills | Status: DC | PRN

## 2017-07-26 MED ORDER — KETOROLAC TROMETHAMINE 60 MG/2ML IM SOLN
60.0000 mg | Freq: Once | INTRAMUSCULAR | Status: AC
  Administered 2017-07-26: 60 mg via INTRAMUSCULAR
  Filled 2017-07-26: qty 2

## 2017-07-26 MED ORDER — PREDNISONE 50 MG PO TABS: 50.0000 mg | ORAL_TABLET | Freq: Every day | ORAL | 0 refills | Status: DC

## 2017-07-26 NOTE — ED Triage Notes (Signed)
Pt states she got up to go to the bathroom last night and her legs gave out. Old injury to left knee, no noted injury to rt knee, pt arrives walking on crutches stating pain is 10/10. No visible distress noted.

## 2017-07-26 NOTE — Discharge Instructions (Signed)
Return here as needed. Follow up with a primary doctor. °

## 2017-07-26 NOTE — ED Provider Notes (Signed)
[facility]-EMERGENCY DEPT Provider Note   CSN: [id number] Arrival date & time: 07/26/17  0730     History   Chief Complaint Chief Complaint  Patient presents with  . Leg Pain    HPI Doris Guerrero is a 32 y.o. female.  HPI Patient presents to the emergency department with bilateral knee and ankle pain along with lower back discomfort.  The patient states that she has been having this pain since around 4:00 this morning she states that her back does not seem to hurt any more than normal.  Patient states that she does not have any leg swelling.  Patient states that she use crutches to help relieve some of the stress on her legs.  Patient is able to walk she states this is helped take some of the pressure off of her legs.  Patient states that she has chronic back discomfort.  Patient states she did not take any medications prior to arrival.  The patient denies chest pain, shortness of breath, headache,blurred vision, neck pain, fever, cough, weakness, numbness, dizziness, anorexia, edema, abdominal pain, nausea, vomiting, diarrhea, rash,  dysuria, hematemesis, bloody stool, near syncope, or syncope. Past Medical History:  Diagnosis Date  . Anxiety   . Chronic back pain   . Mental disorder    bipolar, on meds  . Scoliosis     Patient Active Problem List   Diagnosis Date Noted  . Tingling in left lower leg 05/04/2015  . Scoliosis   . Chronic back pain     Past Surgical History:  Procedure Laterality Date  . NO PAST SURGERIES      OB History    Gravida Para Term Preterm AB Living   0 0 0 0 0 0   SAB TAB Ectopic Multiple Live Births   0 0 0 0         Home Medications    Prior to Admission medications   Medication Sig Start Date End Date Taking? Authorizing Provider  cyclobenzaprine (FLEXERIL) 10 MG tablet Take 10 mg by mouth daily as needed. 06/09/17  Yes [provider]  divalproex (DEPAKOTE) 500 MG DR tablet Take 500 mg by mouth 3  (three) times daily. 07/17/17  Yes [provider]  gabapentin (NEURONTIN) 300 MG capsule Take 300 mg by mouth 3 (three) times daily. 07/17/17  Yes [provider]  QUEtiapine Fumarate (SEROQUEL XR) 150 MG 24 hr tablet Take 150 mg by mouth every evening. 07/17/17  Yes [provider]  traMADol (ULTRAM) 50 MG tablet Take 1 tablet (50 mg total) by mouth every 6 (six) hours as needed. Patient taking differently: Take 50 mg by mouth every 6 (six) hours as needed for severe pain.  06/10/17  Yes Hagler, Arlys John, MD  predniSONE (DELTASONE) 50 MG tablet Take 1 tablet (50 mg total) by mouth daily. 07/26/17   Xee Hollman, Cristal Deer, PA-C  traMADol (ULTRAM) 50 MG tablet Take 1 tablet (50 mg total) by mouth every 6 (six) hours as needed for severe pain. 07/26/17   Claudetta Sallie, Cristal Deer, PA-C    Family History No family history on file.  Social History Social History   Tobacco Use  . Smoking status: Never Smoker  . Smokeless tobacco: Never Used  Substance Use Topics  . Alcohol use: No  . Drug use: No     Allergies   Patient has no known allergies.   Review of Systems Review of Systems All other systems negative except as documented in the HPI. All  pertinent positives and negatives as reviewed in the HPI.  Physical Exam Updated Vital Signs BP 125/75   Pulse (!) 105   Temp 98 F (36.7 C) (Oral)   Resp 16   SpO2 99%   Physical Exam  Constitutional: She is oriented to person, place, and time. She appears well-developed and well-nourished. No distress.  HENT:  Head: Normocephalic and atraumatic.  Mouth/Throat: Oropharynx is clear and moist.  Eyes: Pupils are equal, round, and reactive to light.  Neck: Normal range of motion. Neck supple.  Cardiovascular: Normal rate, regular rhythm and normal heart sounds. Exam reveals no gallop and no friction rub.  No murmur heard. Pulmonary/Chest: Effort normal and breath sounds normal. No respiratory distress. She has no wheezes.    Abdominal: Soft. Bowel sounds are normal. She exhibits no distension. There is no tenderness.  Musculoskeletal:       Right knee: She exhibits normal range of motion, no swelling, no effusion, no ecchymosis and no erythema. Tenderness found.       Left knee: She exhibits normal range of motion, no swelling, no effusion, no ecchymosis, no deformity and no erythema. Tenderness found.       Right ankle: She exhibits normal range of motion, no swelling and no ecchymosis. Tenderness.       Left ankle: She exhibits normal range of motion, no swelling, no ecchymosis and no deformity. Tenderness.       Lumbar back: She exhibits tenderness. She exhibits normal range of motion, no bony tenderness, no swelling, no edema and no deformity.  Neurological: She is alert and oriented to person, place, and time. She has normal strength. No cranial nerve deficit or sensory deficit. She exhibits normal muscle tone. Coordination and gait normal.  Skin: Skin is warm and dry. Capillary refill takes less than 2 seconds. No rash noted. No erythema.  Psychiatric: She has a normal mood and affect. Her behavior is normal.  Nursing note and vitals reviewed.    ED Treatments / Results  Labs (all labs ordered are listed, but only abnormal results are displayed) Labs Reviewed - No data to display  EKG  EKG Interpretation None       Radiology No results found.  Procedures Procedures (including critical care time)  Medications Ordered in ED Medications  ketorolac (TORADOL) injection 60 mg (60 mg Intramuscular Given 07/26/17 0900)     Initial Impression / Assessment and Plan / ED Course  I have reviewed the triage vital signs and the nursing notes.  Pertinent labs & imaging results that were available during my care of the patient were reviewed by me and considered in my medical decision making (see chart for details).  Clinical Course as of Jul 27 1551  Sun Jul 26, 2017  0923 DG Lumbar Spine Complete  [SM]  [id number] DG Lumbar Spine Complete [SM]    Clinical Course User Index [SM] Sharalyn InkMoverman, Sharone, Wisconsintudent-PA    Patient refused lumbar spine imaging.  She does not have any neurological deficits noted on exam she does have normal reflexes and she is able to ambulate.  She does not have any foot drop or does not have any signs of dragging her feet when walking.  Patient is advised follow-up with her primary doctor told return here as needed.  Patient refused the x-ray and was also requesting more pain medications.  I did give her a shot of Toradol.  Patient will be discharged home told to use ice and heat on the areas that  are painful.  Final Clinical Impressions(s) / ED Diagnoses   Final diagnoses:  Bilateral leg pain  Chronic bilateral low back pain, with sciatica presence unspecified    ED Discharge Orders        Ordered    predniSONE (DELTASONE) 50 MG tablet  Daily     07/26/17 0942    traMADol (ULTRAM) 50 MG tablet  Every 6 hours PRN     07/26/17 0942       Charlestine Night, PA-C 07/26/17 1556    Raeford Razor, MD 07/26/17 1623

## 2017-07-26 NOTE — ED Notes (Signed)
Patient refusing lumbar spine x-ray and continuing to request more pain medicine. PA Thayer OhmChris made aware.

## 2017-07-26 NOTE — ED Notes (Signed)
ED Provider at bedside. 

## 2017-11-06 ENCOUNTER — Emergency Department (HOSPITAL_COMMUNITY)
Admission: EM | Admit: 2017-11-06 | Discharge: 2017-11-06 | Disposition: A | Discharge status: Home / Self Care | Payer: Medicaid Other | Attending: Emergency Medicine | Admitting: Emergency Medicine

## 2017-11-06 ENCOUNTER — Encounter (HOSPITAL_COMMUNITY): Payer: Self-pay | Admitting: *Deleted

## 2017-11-06 ENCOUNTER — Emergency Department (HOSPITAL_COMMUNITY): Payer: Medicaid Other

## 2017-11-06 DIAGNOSIS — Z79899 Other long term (current) drug therapy: Secondary | ICD-10-CM | POA: Insufficient documentation

## 2017-11-06 DIAGNOSIS — M25511 Pain in right shoulder: Secondary | ICD-10-CM | POA: Insufficient documentation

## 2017-11-06 DIAGNOSIS — X58XXXA Exposure to other specified factors, initial encounter: Secondary | ICD-10-CM | POA: Diagnosis not present

## 2017-11-06 DIAGNOSIS — Y929 Unspecified place or not applicable: Secondary | ICD-10-CM | POA: Diagnosis not present

## 2017-11-06 DIAGNOSIS — Y9383 Activity, rough housing and horseplay: Secondary | ICD-10-CM | POA: Diagnosis not present

## 2017-11-06 DIAGNOSIS — Y999 Unspecified external cause status: Secondary | ICD-10-CM | POA: Diagnosis not present

## 2017-11-06 DIAGNOSIS — S4991XA Unspecified injury of right shoulder and upper arm, initial encounter: Secondary | ICD-10-CM | POA: Diagnosis present

## 2017-11-06 DIAGNOSIS — F319 Bipolar disorder, unspecified: Secondary | ICD-10-CM | POA: Insufficient documentation

## 2017-11-06 MED ORDER — NAPROXEN 500 MG PO TABS: 500.0000 mg | ORAL_TABLET | Freq: Two times a day (BID) | ORAL | 0 refills | Status: AC

## 2017-11-06 MED ORDER — NAPROXEN 500 MG PO TABS
500.0000 mg | ORAL_TABLET | Freq: Once | ORAL | Status: DC
  Filled 2017-11-06: qty 1

## 2017-11-06 MED ORDER — METHOCARBAMOL 500 MG PO TABS: 500.0000 mg | ORAL_TABLET | Freq: Three times a day (TID) | ORAL | 0 refills | Status: AC | PRN

## 2017-11-06 MED ORDER — LIDOCAINE 5 % EX PTCH: 1.0000 | MEDICATED_PATCH | CUTANEOUS | 0 refills | Status: AC

## 2017-11-06 NOTE — Discharge Instructions (Addendum)
Please read and follow all provided instructions.  You have been seen today for right shoulder pain  Tests performed today include: An x-ray of the affected area - does NOT show any broken bones or dislocations.  Vital signs. See below for your results today.   Medications:  Naproxen is a nonsteroidal anti-inflammatory medication that will help with pain and swelling. Be sure to take this medication as prescribed with food, 1 pill every 12 hours,  It should be taken with food, as it can cause stomach upset, and more seriously, stomach bleeding. Do not take other nonsteroidal anti-inflammatory medications with this such as Advil, Motrin, or Aleve.   Robaxin is the muscle relaxer I have prescribed, this is meant to help with muscle tightness. Be aware that this medication may make you drowsy therefore the first time you take this it should be at a time you are in an environment where you can rest. Do not drive or operate heavy machinery when taking this medication. Do not take other muscle relaxers when taking this medication.   Lidoderm patches-these are topical patches you may place over the area of discomfort.  He may place 1 patch daily.  If these are too expensive asked the pharmacist for the over-the-counter version at these are similar.  You may take Tylenol for over-the-counter dosing safely with these medicines.  We have prescribed you new medication(s) today. Discuss the medications prescribed today with your pharmacist as they can have adverse effects and interactions with your other medicines including over the counter and prescribed medications. Seek medical evaluation if you start to experience new or abnormal symptoms after taking one of these medicines, seek care immediately if you start to experience difficulty breathing, feeling of your throat closing, facial swelling, or rash as these could be indications of a more serious allergic reaction    Be sure to move the shoulder plenty  in order to avoid frozen shoulder.  Follow-up instructions: Please follow-up with your primary care provider or the provided orthopedic physician (bone specialist) if you continue to have significant pain in 1 week. In this case you may have a more severe injury that requires further care.   Return instructions:  Please return if your hand or fingers are numb or tingling, appear gray or blue, or you have severe pain Please return to the Emergency Department if you experience worsening symptoms.  Please return if you have any other emergent concerns. Additional Information:  Your vital signs today were: BP 111/69 (BP Location: Left Arm)    Pulse 62    Temp 98.6 F (37 C) (Oral)    Resp 16    Ht 5' (1.524 m)    Wt 61.2 kg (135 lb)    LMP 10/06/2017    SpO2 100%    BMI 26.37 kg/m  If your blood pressure (BP) was elevated above 135/85 this visit, please have this repeated by your doctor within one month. ---------------

## 2017-11-06 NOTE — ED Provider Notes (Signed)
[facility]-EMERGENCY DEPT Provider Note   CSN: [id number] Arrival date & time: 11/06/17  0908     History   Chief Complaint Chief Complaint  Patient presents with  . Shoulder Pain    HPI Doris Guerrero is a 32 y.o. female with a history of anxiety, scoliosis, and chronic back pain who presents to the emergency department today complaining of right shoulder pain that started last night.  Patient states that she was horse playing/wrestling with her niece when she developed discomfort in the right shoulder.  States is been a constant pain, rates it a 10 out of 10 in severity, worse with range of motion, no alleviating factors.  She tried a muscle relaxer she had at home without relief last night.  Has not tried other interventions prior to arrival.  Denies fever, chills, numbness, weakness, neck pain, or swelling.  HPI  Past Medical History:  Diagnosis Date  . Anxiety   . Chronic back pain   . Mental disorder    bipolar, on meds  . Scoliosis     Patient Active Problem List   Diagnosis Date Noted  . Tingling in left lower leg 05/04/2015  . Scoliosis   . Chronic back pain     Past Surgical History:  Procedure Laterality Date  . NO PAST SURGERIES       OB History    Gravida  0   Para  0   Term  0   Preterm  0   AB  0   Living  0     SAB  0   TAB  0   Ectopic  0   Multiple  0   Live Births               Home Medications    Prior to Admission medications   Medication Sig Start Date End Date Taking? Authorizing Provider  divalproex (DEPAKOTE ER) 500 MG 24 hr tablet Take 500 mg by mouth 3 (three) times daily. 10/12/17  Yes [provider]  gabapentin (NEURONTIN) 600 MG tablet Take 600 mg by mouth at bedtime. 09/14/17  Yes [provider]  QUEtiapine Fumarate (SEROQUEL XR) 150 MG 24 hr tablet Take 150 mg by mouth every evening. 07/17/17  Yes [provider]  tiZANidine (ZANAFLEX) 4 MG tablet Take 4 mg by  mouth 3 (three) times daily as needed for muscle spasms. 09/14/17  Yes [provider]  traMADol (ULTRAM) 50 MG tablet Take 1 tablet (50 mg total) by mouth every 6 (six) hours as needed. Patient taking differently: Take 50 mg by mouth every 6 (six) hours as needed for severe pain.  06/10/17  Yes Hagler, Arlys John, MD  predniSONE (DELTASONE) 50 MG tablet Take 1 tablet (50 mg total) by mouth daily. Patient not taking: Reported on 11/06/2017 07/26/17   Charlestine Night, PA-C  traMADol (ULTRAM) 50 MG tablet Take 1 tablet (50 mg total) by mouth every 6 (six) hours as needed for severe pain. Patient not taking: Reported on 11/06/2017 07/26/17   Charlestine Night, PA-C    Family History History reviewed. No pertinent family history.  Social History Social History   Tobacco Use  . Smoking status: Never Smoker  . Smokeless tobacco: Never Used  Substance Use Topics  . Alcohol use: No  . Drug use: No     Allergies   Patient has no known allergies.   Review of Systems Review of Systems  Constitutional: Negative for chills and fever.  Respiratory: Negative for shortness of breath.   Cardiovascular: Negative for chest pain.  Musculoskeletal: Positive for arthralgias (Right shoulder.). Negative for neck pain.  Neurological: Negative for weakness and numbness.     Physical Exam Updated Vital Signs BP 111/69 (BP Location: Left Arm)   Pulse 62   Temp 98.6 F (37 C) (Oral)   Resp 16   Ht 5' (1.524 m)   Wt 61.2 kg (135 lb)   LMP 10/06/2017   SpO2 100%   BMI 26.37 kg/m   Physical Exam  Constitutional: She appears well-developed and well-nourished. No distress.  HENT:  Head: Normocephalic and atraumatic.  Eyes: Conjunctivae are normal. Right eye exhibits no discharge. Left eye exhibits no discharge.  Neck: Normal range of motion. Neck supple. Muscular tenderness (Right-sided, most significant at the trapezius muscle.) present. No spinous process tenderness present.    Cardiovascular:  Pulses:      Radial pulses are 2+ on the right side, and 2+ on the left side.  Musculoskeletal:  No obvious deformity, appreciable swelling, erythema, ecchymosis, or overlying warmth. Upper extremities: Patient has full range of motion to the left shoulder as well as bilateral elbows and wrist.  Her right shoulder has limited range of motion with flexion, extension, and abduction.  Patient is able to perform each of these range of motion to approximately 90 degrees however is not able to go past this. She is diffusely tender over the R trapezius and deltoid muscle regions. R shoulder diffusely tender without point/focal tenderness. No obvious instability on exam.   Neurological: She is alert.  Patient has 5 out of 5 symmetric grip strength.  Sensation grossly intact bilateral upper extremities.  Patient is able to make okay sign, thumbs up, and cross second and third digits of bilateral upper extremities.Clear speech.   Psychiatric: She has a normal mood and affect. Her behavior is normal. Thought content normal.  Nursing note and vitals reviewed.    ED Treatments / Results  Labs (all labs ordered are listed, but only abnormal results are displayed) Labs Reviewed - No data to display  EKG None  Radiology Dg Shoulder Right  Result Date: 11/06/2017 CLINICAL DATA:  Right shoulder pain and limited range of motion for the past 2 days after wrestling injury. EXAM: RIGHT SHOULDER - 2+ VIEW COMPARISON:  None. FINDINGS: There is no evidence of fracture or dislocation. There is no evidence of arthropathy or other focal bone abnormality. Soft tissues are unremarkable. IMPRESSION: Negative. Electronically Signed   By: Obie Dredge M.D.   On: 11/06/2017 10:06    Procedures Procedures (including critical care time)  Medications Ordered in ED Medications - No data to display   Initial Impression / Assessment and Plan / ED Course  I have reviewed the triage vital signs and  the nursing notes.  Pertinent labs & imaging results that were available during my care of the patient were reviewed by me and considered in my medical decision making (see chart for details).   Patient presents with right shoulder pain status post horseplaying/wrestling.  She is nontoxic appearing, in no apparent distress, vitals WNL.  Exam with diffuse tenderness and limited range of motion secondary to pain.  No midline cervical tenderness. Shoulder X-ray negative for fracture or dislocation.  She is neurovascularly intact distally.  No overlying erythema/warmth, patient is afebrile, do not suspect septic joint at this time.  Suspect muscle related soreness.  Will treat with Naproxen, Robaxin, and lidoderm patches.  Instructed patient she is  not to drive or operate heavy machinery when taking Robaxin.  Stressed the importance of movement/range of motion of the shoulder in order to avoid adhesive capsulitis.  We will have patient follow-up with orthopedics. I discussed results, treatment plan, need for orthopedics follow-up, and return precautions with the patient. Provided opportunity for questions, patient confirmed understanding and is in agreement with plan.   Final Clinical Impressions(s) / ED Diagnoses   Final diagnoses:  Acute pain of right shoulder    ED Discharge Orders        Ordered    naproxen (NAPROSYN) 500 MG tablet  2 times daily     11/06/17 1030    methocarbamol (ROBAXIN) 500 MG tablet  Every 8 hours PRN     11/06/17 1030    lidocaine (LIDODERM) 5 %  Every 24 hours     11/06/17 1030       Kolbi Altadonna, AdamsSamantha R, PA-C 11/06/17 1510    Charlynne PanderYao, David Hsienta, MD 11/06/17 1547

## 2017-11-06 NOTE — ED Triage Notes (Signed)
Pt complains of right shoulder pain and limited ROM x 2 days. Pain began when wrestling with her niece.

## 2017-11-06 NOTE — ED Notes (Signed)
Pt refused naproxen at discharge. Discharge instructions and prescriptions reviewed with the pt and pt denies any questions at this time. Pt alert and ambulatory at discharge.

## 2017-11-06 NOTE — ED Notes (Signed)
Pt given an ice pack for her shoulder

## 2017-12-24 ENCOUNTER — Encounter: Payer: Self-pay | Admitting: Neurology

## 2018-02-24 ENCOUNTER — Encounter: Payer: Self-pay | Admitting: Neurology

## 2018-02-24 ENCOUNTER — Ambulatory Visit (INDEPENDENT_AMBULATORY_CARE_PROVIDER_SITE_OTHER): Payer: Medicaid Other | Admitting: Neurology

## 2018-02-24 VITALS — BP 110/74 | HR 105 | Ht 60.0 in | Wt 109.1 lb

## 2018-02-24 DIAGNOSIS — R200 Anesthesia of skin: Secondary | ICD-10-CM

## 2018-02-24 NOTE — Patient Instructions (Addendum)
NCS/EMG of the legs  Please follow up with [facility] Sc[facility] for management of your back pain   ELECTROMYOGRAM AND NERVE CONDUCTION STUDIES (EMG/NCS) INSTRUCTIONS  How to Prepare The neurologist conducting the EMG will need to know if you have certain medical conditions. Tell the neurologist and other EMG lab personnel if you: . Have a pacemaker or any other electrical medical device . Take blood-thinning medications . Have hemophilia, a blood-clotting disorder that causes prolonged bleeding Bathing Take a shower or bath shortly before your exam in order to remove oils from your skin. Don't apply lotions or creams before the exam.  What to Expect You'll likely be asked to change into a hospital gown for the procedure and lie down on an examination table. The following explanations can help you understand what will happen during the exam.  . Electrodes. The neurologist or a technician places surface electrodes at various locations on your skin depending on where you're experiencing symptoms. Or the neurologist may insert needle electrodes at different sites depending on your symptoms.  . Sensations. The electrodes will at times transmit a tiny electrical current that you may feel as a twinge or spasm. The needle electrode may cause discomfort or pain that usually ends shortly after the needle is removed. If you are concerned about discomfort or pain, you may want to talk to the neurologist about taking a short break during the exam.  . Instructions. During the needle EMG, the neurologist will assess whether there is any spontaneous electrical activity when the muscle is at rest - activity that isn't present in healthy muscle tissue - and the degree of activity when you slightly contract the muscle.  He or she will give you instructions on resting and contracting a muscle at appropriate times. Depending on what muscles and nerves the neurologist is examining, he or she may ask you to change  positions during the exam.  After your EMG You may experience some temporary, minor bruising where the needle electrode was inserted into your muscle. This bruising should fade within several days. If it persists, contact your primary care doctor.

## 2018-02-24 NOTE — Progress Notes (Signed)
[facility] Neurology Division Clinic Note - Initial Visit   Date: 02/24/18  Doris Guerrero MRN: [id number] DOB: [birth year]   Dear Dr. Julio Sicks:  Thank you for your kind referral of Doris Guerrero for consultation of bilateral leg numbness/weakness. Although her history is well known to you, please allow [place] to reiterate it for the purpose of our medical record. The patient was accompanied to the clinic by self.   History of Present Illness: Doris Guerrero is a 32 y.o. right-handed [place] American female with bipolar depression, anxiety, and chronic low back pain presenting for evaluation of bilateral leg weakness and numbness.    She has scoliosis since the age of 48 and has chronic back pain, described as achy.  Pain is aggravated by standing and walking, improved by rest.  She has seen at [facility] in September 2017 whose note is reviewed.  At that visit, plain film x-ray showed 14 degree thoracolumbar curve.  Pain was suspected to be muscular strain and she was offered pain relief with gabapentin, flexeril, and voltaren gel.  Several months ago, she reports having two episodes of numbness involving both legs which lasted for one week associated with low back pain.  She did not have bowel/bladder problems.  She has been referred for further evaluation.  She is walking unassisted and uses crutches when low back pain and knee pain is severe.    Past Medical History:  Diagnosis Date  . Anxiety   . Chronic back pain   . Depression   . Mental disorder    bipolar, on meds  . Scoliosis     Past Surgical History:  Procedure Laterality Date  . NO PAST SURGERIES       Medications:  Outpatient Encounter Medications as of 02/24/2018  Medication Sig  . baclofen (LIORESAL) 10 MG tablet TK 1 T PO TID PRN  . divalproex (DEPAKOTE ER) 500 MG 24 hr tablet Take 500 mg by mouth 3 (three) times daily.  . QUEtiapine (SEROQUEL XR) 200 MG 24 hr tablet TK 1 T PO Q EVENING  .  traMADol (ULTRAM) 50 MG tablet Take 1 tablet (50 mg total) by mouth every 6 (six) hours as needed. (Patient taking differently: Take 50 mg by mouth every 6 (six) hours as needed for severe pain. )  . gabapentin (NEURONTIN) 600 MG tablet Take 600 mg by mouth at bedtime.  . lidocaine (LIDODERM) 5 % Place 1 patch onto the skin daily. Remove & Discard patch within 12 hours or as directed by MD (Patient not taking: Reported on 02/24/2018)  . methocarbamol (ROBAXIN) 500 MG tablet Take 1 tablet (500 mg total) by mouth every 8 (eight) hours as needed for muscle spasms. (Patient not taking: Reported on 02/24/2018)  . naproxen (NAPROSYN) 500 MG tablet Take 1 tablet (500 mg total) by mouth 2 (two) times daily. (Patient not taking: Reported on 02/24/2018)  . tiZANidine (ZANAFLEX) 4 MG tablet Take 4 mg by mouth 3 (three) times daily as needed for muscle spasms.  . [DISCONTINUED] QUEtiapine Fumarate (SEROQUEL XR) 150 MG 24 hr tablet Take 150 mg by mouth every evening.   No facility-administered encounter medications on file as of 02/24/2018.      Allergies: No Known Allergies  Family History: Family History  Problem Relation Age of Onset  . Mental illness Father   . Depression Father     Social History: Social History   Tobacco Use  . Smoking status: Never Smoker  . Smokeless  tobacco: Never Used  Substance Use Topics  . Alcohol use: Yes    Comment: occasionally  . Drug use: No   Social History   Social History Narrative   Lives with fiancee in an apartment on the 2nd floor.  Has 2 children.  Not working.  On disability for depression and bipolar disorder.  Education: high school.     Review of Systems:  CONSTITUTIONAL: No fevers, chills, night sweats, or weight loss.   EYES: No visual changes or eye pain ENT: No hearing changes.  No history of nose bleeds.   RESPIRATORY: No cough, wheezing and shortness of breath.   CARDIOVASCULAR: Negative for chest pain, and palpitations.   GI: Negative  for abdominal discomfort, blood in stools or black stools.  No recent change in bowel habits.   GU:  No history of incontinence.   MUSCLOSKELETAL: No history of joint pain or swelling.  +myalgias.   SKIN: Negative for lesions, rash, and itching.   HEMATOLOGY/ONCOLOGY: Negative for prolonged bleeding, bruising easily, and swollen nodes.  No history of cancer.   ENDOCRINE: Negative for cold or heat intolerance, polydipsia or goiter.   PSYCH:  +depression or anxiety symptoms.   NEURO: As Above.   Vital Signs:  BP 110/74   Pulse (!) 105   Ht 5' (1.524 m)   Wt 109 lb 2 oz (49.5 kg)   SpO2 98%   BMI 21.31 kg/m   General Medical Exam:   General:  Well appearing, comfortable.  Multiple tattoos and piercings  Eyes/ENT: see cranial nerve examination.   Neck: No masses appreciated.  Full range of motion without tenderness.  No carotid bruits. Respiratory:  Clear to auscultation, good air entry bilaterally.   Cardiac:  Regular rate and rhythm, no murmur.   Extremities:  No deformities, edema, or skin discoloration.  Skin:  No rashes or lesions.  Neurological Exam: MENTAL STATUS including orientation to time, place, person, recent and remote memory, attention span and concentration, language, and fund of knowledge is normal.  Speech is not dysarthric.  CRANIAL NERVES: II:  No visual field defects.  Unremarkable fundi.   III-IV-VI: Pupils equal round and reactive to light.  Normal conjugate, extra-ocular eye movements in all directions of gaze.  No nystagmus.  No ptosis.   V:  Normal facial sensation.     VII:  Normal facial symmetry and movements.    VIII:  Normal hearing and vestibular function.   IX-X:  Normal palatal movement.   XI:  Normal shoulder shrug and head rotation.   XII:  Normal tongue strength and range of motion, no deviation or fasciculation.  MOTOR:  No atrophy, fasciculations or abnormal movements.  No pronator drift.  Tone is normal.    Right Upper Extremity:    Left  Upper Extremity:    Deltoid  5/5   Deltoid  5/5   Biceps  5/5   Biceps  5/5   Triceps  5/5   Triceps  5/5   Wrist extensors  5/5   Wrist extensors  5/5   Wrist flexors  5/5   Wrist flexors  5/5   Finger extensors  5/5   Finger extensors  5/5   Finger flexors  5/5   Finger flexors  5/5   Dorsal interossei  5/5   Dorsal interossei  5/5   Abductor pollicis  5/5   Abductor pollicis  5/5   Tone (Ashworth scale)  0  Tone (Ashworth scale)  0   Right  Lower Extremity:    Left Lower Extremity:    Hip flexors  5/5   Hip flexors  5/5   Hip extensors  5/5   Hip extensors  5/5   Knee flexors  5/5   Knee flexors  5/5   Knee extensors  5/5   Knee extensors  5/5   Dorsiflexors  5/5   Dorsiflexors  5/5   Plantarflexors  5/5   Plantarflexors  5/5   Toe extensors  5/5   Toe extensors  5/5   Toe flexors  5/5   Toe flexors  5/5   Tone (Ashworth scale)  0  Tone (Ashworth scale)  0   MSRs:  Right                                                                 Left brachioradialis 2+  brachioradialis 2+  biceps 2+  biceps 2+  triceps 2+  triceps 2+  patellar 2+  patellar 2+  ankle jerk 2+  ankle jerk 2+  Hoffman no  Hoffman no  plantar response down  plantar response down   SENSORY:  Normal and symmetric perception of light touch, pinprick, vibration, and proprioception.  Marland Kitchen.   COORDINATION/GAIT: Normal finger-to- nose-finger.  Intact rapid alternating movements bilaterally.  Able to rise from a chair without using arms.  Gait narrow based and stable. Tandem and stressed gait intact.    IMPRESSION: Transient bilateral leg numbness with normal neurological exam is reassuring.  The diffuse nature of her symptoms makes primary nerve pathology low and if this was spinal cord pathology, I would expect brisk reflexes, abnormal tone, or other upper motor neuron findings.  NCS/EMG of the legs to assess for peripheral nerve pathology.    Chronic back pain seems muscular in origin.  Patient was informed that my  office does not manage chronic pain and she would need to establish care with pain management.  For her scoliosis, which is mild, she should follow-up with [facility].   Thank you for allowing me to participate in patient's care.  If I can answer any additional questions, I would be pleased to do so.    Sincerely,    Doris Fauth K. Allena KatzPatel, DO

## 2018-03-11 ENCOUNTER — Ambulatory Visit (INDEPENDENT_AMBULATORY_CARE_PROVIDER_SITE_OTHER): Payer: Medicaid Other | Admitting: Neurology

## 2018-03-11 DIAGNOSIS — R200 Anesthesia of skin: Secondary | ICD-10-CM

## 2018-03-11 NOTE — Procedures (Signed)
[facility] Neurology  [address] [place], [address]  [place], [place] [address] Tel: [phone] Fax:  [phone] Test Date:  03/11/2018  Patient: Doris Guerrero DOB: [birth year] Physician: Nita Sickle, DO  Sex: Female Height: 5' " Ref Phys: Nita Sickle, DO  ID#: [id number] Temp: 32.9C Technician:    Patient Complaints: This is a 32 year-old female referred for evaluation of transient bilateral leg numbness.  NCV & EMG Findings: Extensive electrodiagnostic testing of the right lower extremity and additional studies of the left shows:  1. Bilateral sural and superficial peroneal sensory responses are within normal limits. 2. Bilateral tibial and peroneal motor responses are within normal limits. 3. Bilateral tibial H reflex studies are within normal limits. 4. There is no evidence of active or chronic motor axon loss changes affecting any of the tested muscles. Motor unit configuration and recruitment pattern is within normal limits.  Impression: This is a normal study of the lower extremities. In particular, there is no evidence of a large fiber sensorimotor neuropathy or lumbosacral radiculopathy.   ___________________________ Nita Sickle, DO    Nerve Conduction Studies Anti Sensory Summary Table   Stim Site NR Peak (ms) Norm Peak (ms) P-T Amp (V) Norm P-T Amp  Left Sup Peroneal Anti Sensory (Ant Lat Mall)  32.9C  12 cm    2.6 <4.5 19.9 >5  Right Sup Peroneal Anti Sensory (Ant Lat Mall)  32.9C  12 cm    2.2 <4.5 19.9 >5  Left Sural Anti Sensory (Lat Mall)  32.9C  Calf    3.1 <4.5 25.9 >5  Right Sural Anti Sensory (Lat Mall)  32.9C  Calf    3.1 <4.5 24.4 >5   Motor Summary Table   Stim Site NR Onset (ms) Norm Onset (ms) O-P Amp (mV) Norm O-P Amp Site1 Site2 Delta-0 (ms) Dist (cm) Vel (m/s) Norm Vel (m/s)  Left Peroneal Motor (Ext Dig Brev)  32.9C  Ankle    3.4 <5.5 6.4 >3 B Fib Ankle 6.3 35.0 56 >40  B Fib    9.7  6.5  Poplt B Fib 1.0 6.0 60 >40  Poplt    10.7   6.5         Right Peroneal Motor (Ext Dig Brev)  32.9C  Ankle    3.7 <5.5 5.6 >3 B Fib Ankle 6.8 34.0 50 >40  B Fib    10.5  5.0  Poplt B Fib 1.5 7.0 47 >40  Poplt    12.0  4.8         Left Tibial Motor (Abd Hall Brev)  32.9C  Ankle    3.3 <6.0 12.3 >8 Knee Ankle 6.8 36.0 53 >40  Knee    10.1  9.2         Right Tibial Motor (Abd Hall Brev)  32.9C  Ankle    2.7 <6.0 14.7 >8 Knee Ankle 8.7 38.0 44 >40  Knee    11.4  12.2          H Reflex Studies   NR H-Lat (ms) Lat Norm (ms) L-R H-Lat (ms)  Left Tibial (Gastroc)  32.9C     28.71 <35 2.04  Right Tibial (Gastroc)  32.9C     26.67 <35 2.04   EMG   Side Muscle Ins Act Fibs Psw Fasc Number Recrt Dur Dur. Amp Amp. Poly Poly. Comment  Left AntTibialis Nml Nml Nml Nml Nml Nml Nml Nml Nml Nml Nml Nml N/A  Left Gastroc Nml Nml Nml Nml  Nml Nml Nml Nml Nml Nml Nml Nml N/A  Left Flex Dig Long Nml Nml Nml Nml Nml Nml Nml Nml Nml Nml Nml Nml N/A  Left RectFemoris Nml Nml Nml Nml Nml Nml Nml Nml Nml Nml Nml Nml N/A  Left GluteusMed Nml Nml Nml Nml Nml Nml Nml Nml Nml Nml Nml Nml N/A  Right AntTibialis Nml Nml Nml Nml Nml Nml Nml Nml Nml Nml Nml Nml N/A  Right Gastroc Nml Nml Nml Nml Nml Nml Nml Nml Nml Nml Nml Nml N/A  Right Flex Dig Long Nml Nml Nml Nml Nml Nml Nml Nml Nml Nml Nml Nml N/A  Right RectFemoris Nml Nml Nml Nml Nml Nml Nml Nml Nml Nml Nml Nml N/A  Right GluteusMed Nml Nml Nml Nml Nml Nml Nml Nml Nml Nml Nml Nml N/A      Waveforms:

## 2018-03-12 ENCOUNTER — Telehealth: Payer: Self-pay | Admitting: *Deleted

## 2018-03-12 NOTE — Telephone Encounter (Signed)
Patient given results and instructions.   

## 2018-03-12 NOTE — Telephone Encounter (Signed)
-----   Message from [place] Chardonika K Patel, DO sent at 03/11/2018  3:06 PM EDT ----- Please inform patient that her nerve testing is normal (no nerve impingement).  Nothing worrisome causing her numbness, will need to monitor symptoms.  For pain, follow-up with her scoliosis doctors. Thanks.

## 2018-10-07 ENCOUNTER — Ambulatory Visit
Admission: RE | Admit: 2018-10-07 | Discharge: 2018-10-07 | Disposition: A | Discharge status: Home / Self Care | Payer: Medicaid Other | Source: Ambulatory Visit | Attending: Orthopaedic Surgery | Admitting: Orthopaedic Surgery

## 2018-10-07 ENCOUNTER — Other Ambulatory Visit: Payer: Self-pay

## 2018-10-07 ENCOUNTER — Other Ambulatory Visit: Payer: Self-pay | Admitting: Orthopaedic Surgery

## 2018-10-07 DIAGNOSIS — M545 Low back pain, unspecified: Secondary | ICD-10-CM

## 2021-03-03 ENCOUNTER — Emergency Department: Payer: Medicaid Other

## 2021-03-03 ENCOUNTER — Emergency Department
Admission: EM | Admit: 2021-03-03 | Discharge: 2021-03-04 | Disposition: A | Discharge status: Home / Self Care | Payer: Medicaid Other | Attending: Emergency Medicine | Admitting: Emergency Medicine

## 2021-03-03 DIAGNOSIS — R569 Unspecified convulsions: Secondary | ICD-10-CM

## 2021-03-03 LAB — CBC
HCT: 34.4 % — ABNORMAL LOW (ref 36.0–46.0)
Hemoglobin: 11.2 g/dL — ABNORMAL LOW (ref 12.0–15.0)
MCH: 25.5 pg — ABNORMAL LOW (ref 26.0–34.0)
MCHC: 32.6 g/dL (ref 30.0–36.0)
MCV: 78.2 fL — ABNORMAL LOW (ref 80.0–100.0)
Platelets: 302 10*3/uL (ref 150–400)
RBC: 4.4 MIL/uL (ref 3.87–5.11)
RDW: 15.8 % — ABNORMAL HIGH (ref 11.5–15.5)
WBC: 7.6 10*3/uL (ref 4.0–10.5)
nRBC: 0 % (ref 0.0–0.2)

## 2021-03-03 LAB — COMPREHENSIVE METABOLIC PANEL
ALT: 9 U/L (ref 0–44)
AST: 17 U/L (ref 15–41)
Albumin: 4.2 g/dL (ref 3.5–5.0)
Alkaline Phosphatase: 33 U/L — ABNORMAL LOW (ref 38–126)
Anion gap: 7 (ref 5–15)
BUN: 18 mg/dL (ref 6–20)
CO2: 25 mmol/L (ref 22–32)
Calcium: 8.9 mg/dL (ref 8.9–10.3)
Chloride: 105 mmol/L (ref 98–111)
Creatinine, Ser: 0.77 mg/dL (ref 0.44–1.00)
GFR, Estimated: >60 mL/min (ref >60)
Glucose, Bld: 98 mg/dL (ref 70–99)
Potassium: 4.6 mmol/L (ref 3.5–5.1)
Sodium: 137 mmol/L (ref 135–145)
Total Bilirubin: 0.5 mg/dL (ref 0.3–1.2)
Total Protein: 7.9 g/dL (ref 6.5–8.1)

## 2021-03-03 LAB — TROPONIN I (HIGH SENSITIVITY): Troponin I (High Sensitivity): 2 ng/L (ref <18)

## 2021-03-03 LAB — CBG MONITORING, ED: Glucose-Capillary: 85 mg/dL (ref 70–99)

## 2021-03-03 LAB — URINE DRUG SCREEN, QUALITATIVE (ARMC ONLY)
Amphetamines, Ur Screen: NOT DETECTED
Barbiturates, Ur Screen: NOT DETECTED
Benzodiazepine, Ur Scrn: POSITIVE — AB
Cannabinoid 50 Ng, Ur ~~LOC~~: POSITIVE — AB
Cocaine Metabolite,Ur ~~LOC~~: NOT DETECTED
MDMA (Ecstasy)Ur Screen: NOT DETECTED
Methadone Scn, Ur: NOT DETECTED
Opiate, Ur Screen: NOT DETECTED
Phencyclidine (PCP) Ur S: NOT DETECTED
Tricyclic, Ur Screen: POSITIVE — AB

## 2021-03-03 LAB — PREGNANCY, URINE: Preg Test, Ur: NEGATIVE

## 2021-03-03 MED ORDER — MIDAZOLAM HCL 2 MG/2ML IJ SOLN: 4.0000 mg | Freq: Once | INTRAMUSCULAR | Status: DC

## 2021-03-03 MED ORDER — MIDAZOLAM HCL 2 MG/2ML IJ SOLN: 1.0000 mg | Freq: Once | INTRAMUSCULAR | Status: DC

## 2021-03-03 MED ORDER — AMMONIA AROMATIC IN INHA
RESPIRATORY_TRACT | Status: AC
  Filled 2021-03-03: qty 20

## 2021-03-03 MED ORDER — MIDAZOLAM HCL 2 MG/2ML IJ SOLN
INTRAMUSCULAR | Status: AC
  Filled 2021-03-03: qty 4

## 2021-03-03 NOTE — ED Provider Notes (Signed)
[facility] Emergency Department Provider Note   ____________________________________________    I have reviewed the triage vital signs and the nursing notes.   HISTORY  Chief Complaint Seizures     HPI Doris Guerrero is a 35 y.o. female who presents with seizure-like activity.  Patient has a history of bipolar disorder apparently on baclofen, Depakote, Seroquel, tizanidine and gabapentin.  Family concerned that patient had seizure-like activity at home, apparently vomited in route to the hospital.    Father apparently shared with wife that patient used "an edible "prior to this event   Past Medical History:  Diagnosis Date   Anxiety    Chronic back pain    Depression    Mental disorder    bipolar, on meds   Scoliosis     Patient Active Problem List   Diagnosis Date Noted   Tingling in left lower leg 05/04/2015   Scoliosis    Chronic back pain     Past Surgical History:  Procedure Laterality Date   NO PAST SURGERIES      Prior to Admission medications   Medication Sig Start Date End Date Taking? Authorizing Provider  baclofen (LIORESAL) 10 MG tablet TK 1 T PO TID PRN 12/23/17   [provider]  divalproex (DEPAKOTE ER) 500 MG 24 hr tablet Take 500 mg by mouth 3 (three) times daily. 10/12/17   [provider]  gabapentin (NEURONTIN) 600 MG tablet Take 600 mg by mouth at bedtime. 09/14/17   [provider]  lidocaine (LIDODERM) 5 % Place 1 patch onto the skin daily. Remove & Discard patch within 12 hours or as directed by MD Patient not taking: Reported on 02/24/2018 11/06/17   Petrucelli, Pleas Koch, PA-C  methocarbamol (ROBAXIN) 500 MG tablet Take 1 tablet (500 mg total) by mouth every 8 (eight) hours as needed for muscle spasms. Patient not taking: Reported on 02/24/2018 11/06/17   Petrucelli, Lelon Mast R, PA-C  naproxen (NAPROSYN) 500 MG tablet Take 1 tablet (500 mg total) by mouth 2 (two) times daily. Patient not  taking: Reported on 02/24/2018 11/06/17   Petrucelli, Lelon Mast R, PA-C  QUEtiapine (SEROQUEL XR) 200 MG 24 hr tablet TK 1 T PO Q EVENING 12/23/17   [provider]  tiZANidine (ZANAFLEX) 4 MG tablet Take 4 mg by mouth 3 (three) times daily as needed for muscle spasms. 09/14/17   [provider]  traMADol (ULTRAM) 50 MG tablet Take 1 tablet (50 mg total) by mouth every 6 (six) hours as needed. Patient taking differently: Take 50 mg by mouth every 6 (six) hours as needed for severe pain.  06/10/17   Mardella Layman, MD     Allergies Patient has no known allergies.  Family History  Problem Relation Age of Onset   Mental illness Father    Depression Father     Social History Social History   Tobacco Use   Smoking status: Never   Smokeless tobacco: Never  Vaping Use   Vaping Use: Never used  Substance Use Topics   Alcohol use: Yes    Comment: occasionally   Drug use: No    Unable to obtain review of Systems     ____________________________________________   PHYSICAL EXAM:  VITAL SIGNS: ED Triage Vitals  Enc Vitals Group     BP 03/03/21 1853 (!) 65/51     Pulse Rate 03/03/21 1853 (!) 153     Resp 03/03/21 1904 18     Temp 03/03/21  2005 98.6 F (37 C)     Temp Source 03/03/21 2005 Oral     SpO2 03/03/21 1853 100 %     Weight --      Height --      Head Circumference --      Peak Flow --      Pain Score --      Pain Loc --      Pain Edu? --      Excl. in GC? --     Constitutional: Intermittent shaking of the extremities Eyes: Conjunctivae are normal.    Mouth/Throat: Mucous membranes are moist.   Neck:  Painless ROM Cardiovascular: Tachycardia regular rhythm. Grossly normal heart sounds.  Good peripheral circulation. Respiratory: Normal respiratory effort.  No retractions.  Gastrointestinal: Soft and nontender. No distention.   Musculoskeletal: No lower extremity tenderness nor edema.  Warm and well perfused Neurologic:   Moving all  extremities equally, unclear whether this is seizure activity not consistent with generalized tonic-clonic seizure, patient does seem to respond intermittently to stimulus Skin:  Skin is warm, dry and intact. No rash noted.   ____________________________________________   LABS (all labs ordered are listed, but only abnormal results are displayed)  Labs Reviewed  CBC - Abnormal; Notable for the following components:      Result Value   Hemoglobin 11.2 (*)    HCT 34.4 (*)    MCV 78.2 (*)    MCH 25.5 (*)    RDW 15.8 (*)    All other components within normal limits  COMPREHENSIVE METABOLIC PANEL - Abnormal; Notable for the following components:   Alkaline Phosphatase 33 (*)    All other components within normal limits  URINE DRUG SCREEN, QUALITATIVE ([facility] ONLY) - Abnormal; Notable for the following components:   Tricyclic, Ur Screen POSITIVE (*)    Cannabinoid 50 Ng, Ur [place] POSITIVE (*)    Benzodiazepine, Ur Scrn POSITIVE (*)    All other components within normal limits  PREGNANCY, URINE  CBG MONITORING, ED  TROPONIN I (HIGH SENSITIVITY)   ____________________________________________  EKG ED ECG REPORT I, Jene Every, the attending physician, personally viewed and interpreted this ECG.  Date: 03/03/2021  Rhythm: Sinus tachycardia QRS Axis: normal Intervals: normal ST/T Wave abnormalities: normal Narrative Interpretation: no evidence of acute ischemia  ____________________________________________  RADIOLOGY  CT head reviewed by me, no acute abnormality ____________________________________________   PROCEDURES  Procedure(s) performed: No  Procedures   Critical Care performed: No ____________________________________________   INITIAL IMPRESSION / ASSESSMENT AND PLAN / ED COURSE  Pertinent labs & imaging results that were available during my care of the patient were reviewed by me and considered in my medical decision making (see chart for details).    Patient presents with altered mental status, questionable seizure-like activity.  Treated with 2 mg of IV Versed with immediate resolution of symptoms.  Lab work is unremarkable, CT head is unremarkable.  ----------------------------------------- 11:12 PM on 03/03/2021 ----------------------------------------- No further seizure-like activity, patient appears to be improving  I do suspect this could be related to marijuana or delta 8 edible  I have asked my colleague to observe the patient for continued improvement,    ____________________________________________   FINAL CLINICAL IMPRESSION(S) / ED DIAGNOSES  Final diagnoses:  Seizure-like activity (HCC)        Note:  This document was prepared using Dragon voice recognition software and may include unintentional dictation errors.    Jene Every, MD 03/03/21 [phone]

## 2021-03-03 NOTE — ED Notes (Signed)
2mg versed given IV.

## 2021-03-03 NOTE — ED Triage Notes (Addendum)
Pt was carried into lobby by father- pt wife states that she witnessed her having a seizure- pt taken to triage room and started having shaking- Amber RN sternal rubbed pt and pt opened her eyes- pt drawing back when [facility] tested her startled reflex- pt HR elevated- pt not answering questions- per wife pt vomited in the car- prior to activity starting pt was eating dinner with her wife

## 2021-03-04 NOTE — Discharge Instructions (Addendum)
Please seek medical attention for any high fevers, chest pain, shortness of breath, change in behavior, persistent vomiting, bloody stool or any other new or concerning symptoms.  

## 2021-03-04 NOTE — ED Provider Notes (Signed)
At reexam the patient is calm. She awakens to verbal stimuli. States that she is feeling better and would like to go home. Will prepare discharge paperwork.   Phineas Semen, MD 03/04/21 [phone]

## 2022-10-10 IMAGING — CT CT HEAD W/O CM
3 series · 16 of 47 positions shown, 19 images · non-contrast
Comparison: Head CT dated 06/17/2004.

CLINICAL DATA: Seizure.  No trauma.

EXAM:
CT HEAD WITHOUT CONTRAST
TECHNIQUE: Contiguous axial images were obtained from the base of the skull
through the vertex without intravenous contrast.

[Series 3: head (person_name) (person_name) · axial · 0.42mm/px · z∈[-186,-51]mm · 10 of 33 slices shown, 13 images]
[im 3/33  brain]
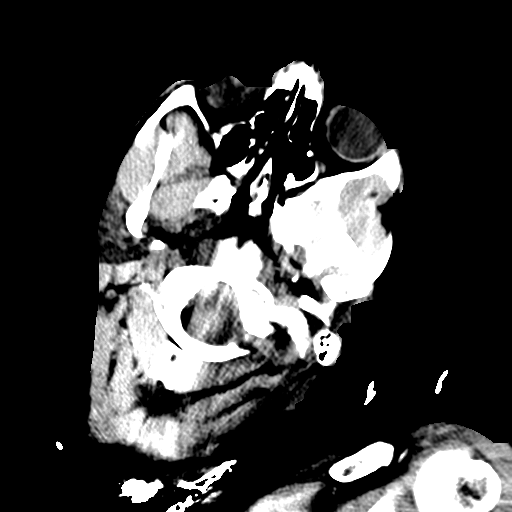
[im 3/33  bone]
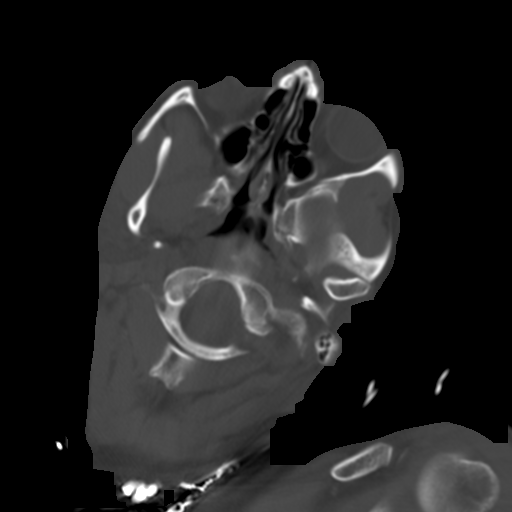
[im 6/33  brain]
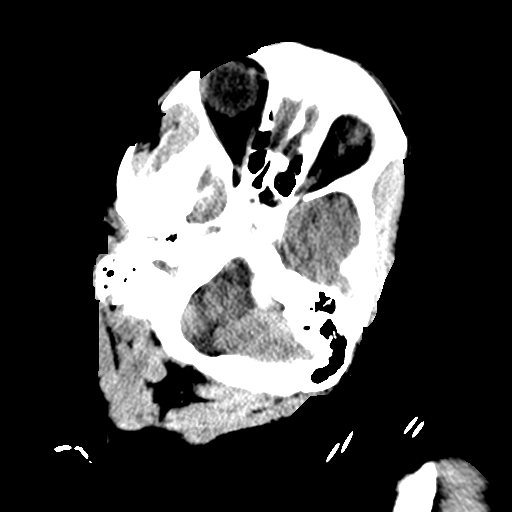
[im 9/33  brain]
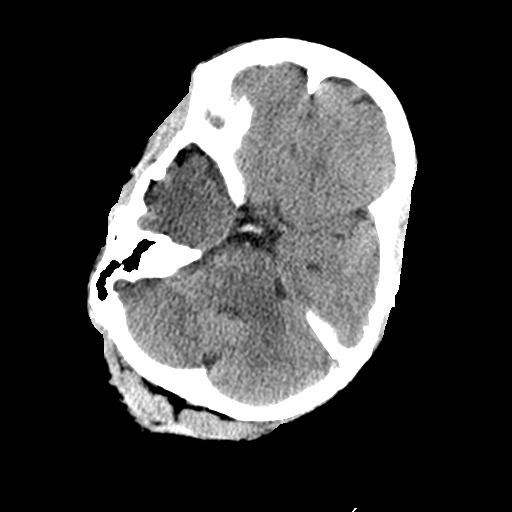
[im 12/33  brain]
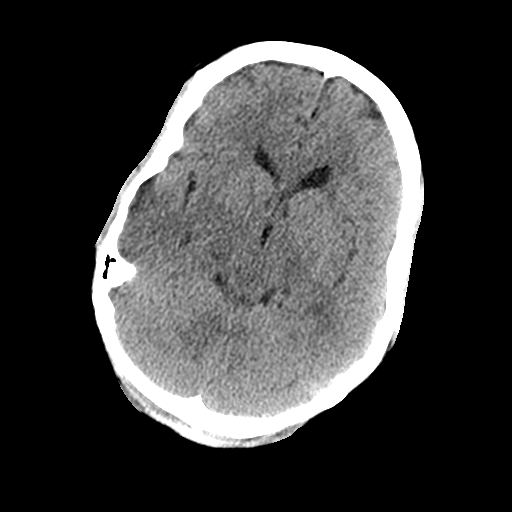
[im 15/33  brain]
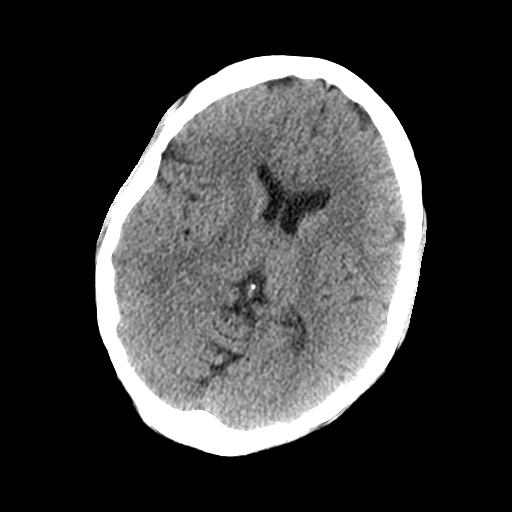
[im 15/33  bone]
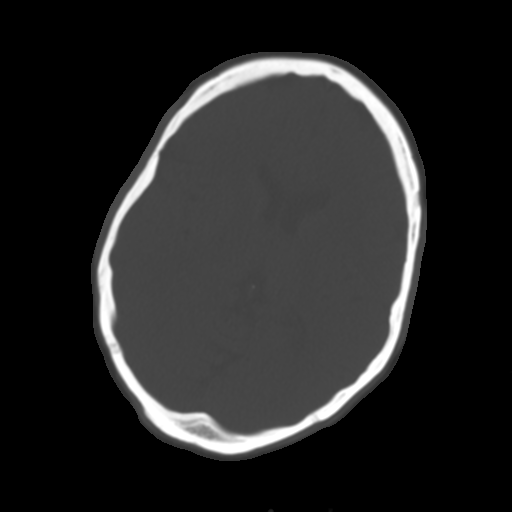
[im 18/33  brain]
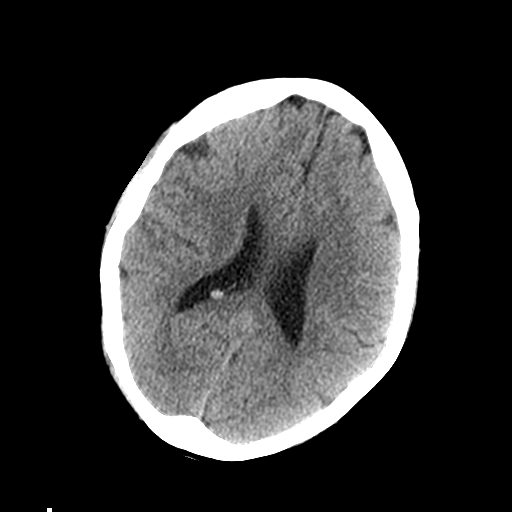
[im 21/33  brain]
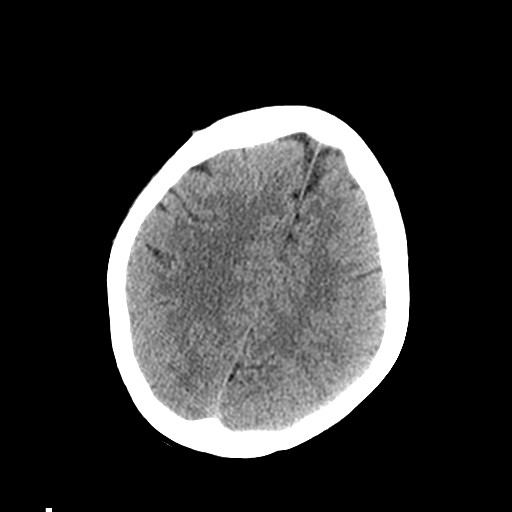
[im 25/33  brain]
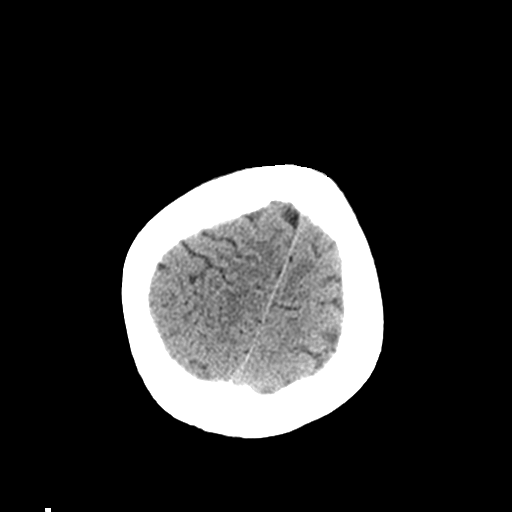
[im 27/33  brain]
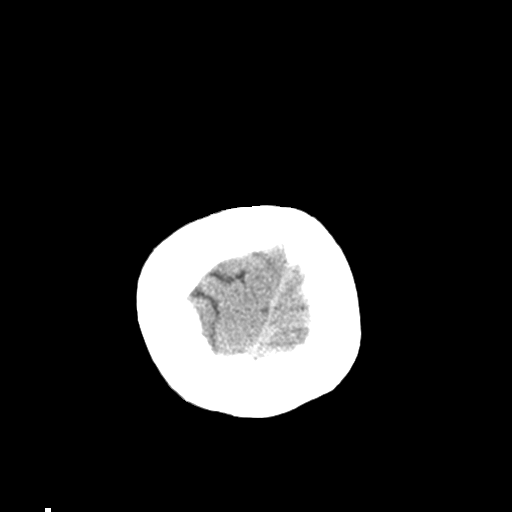
[im 27/33  bone]
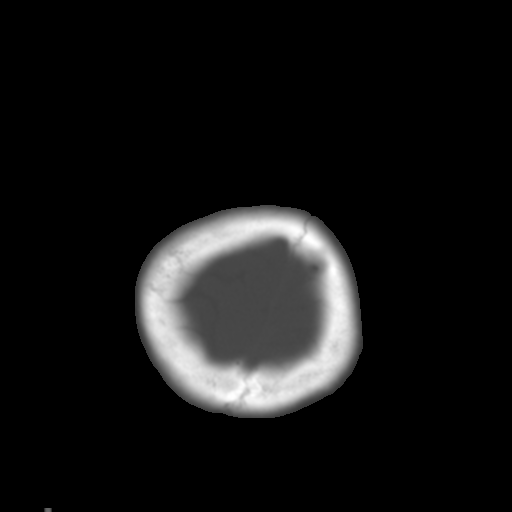
[im 30/33  brain]
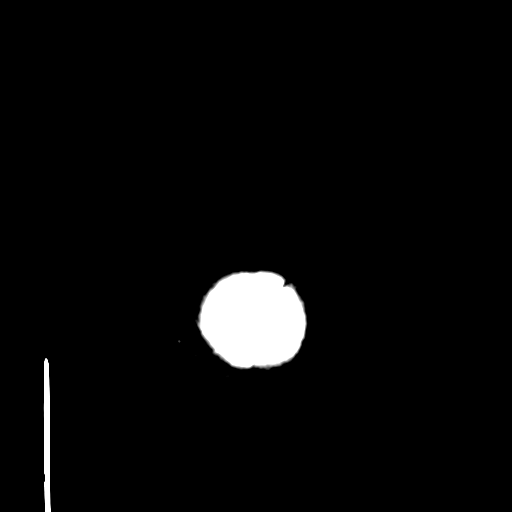

[Series 5: coronal soft tissue · coronal · 0.31mm/px · 3 of 69 slices shown]
[im 23/69  brain]
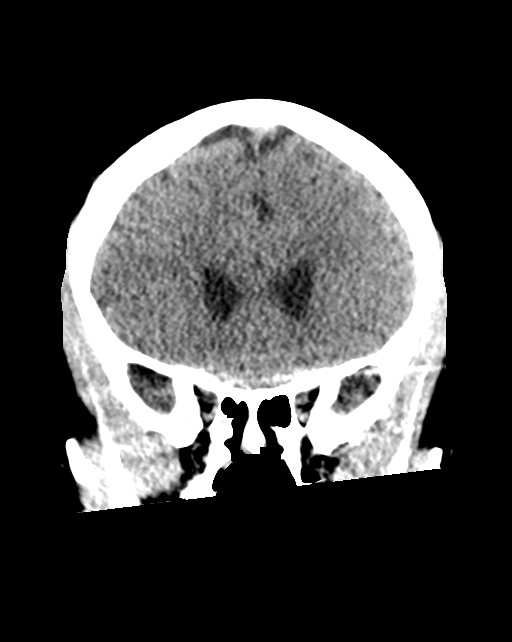
[im 31/69  brain]
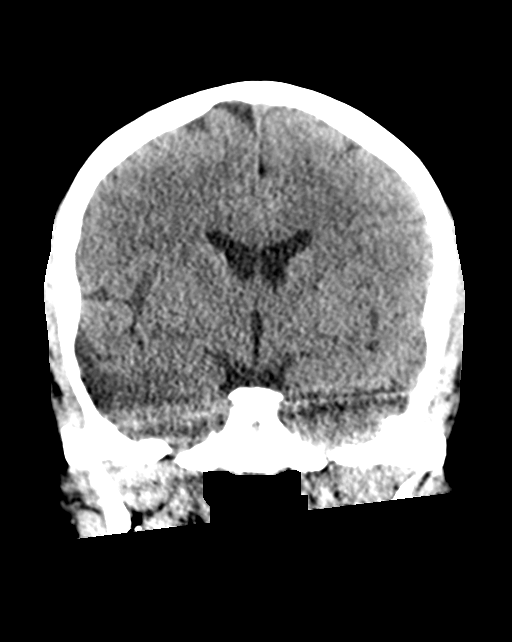
[im 38/69  brain]
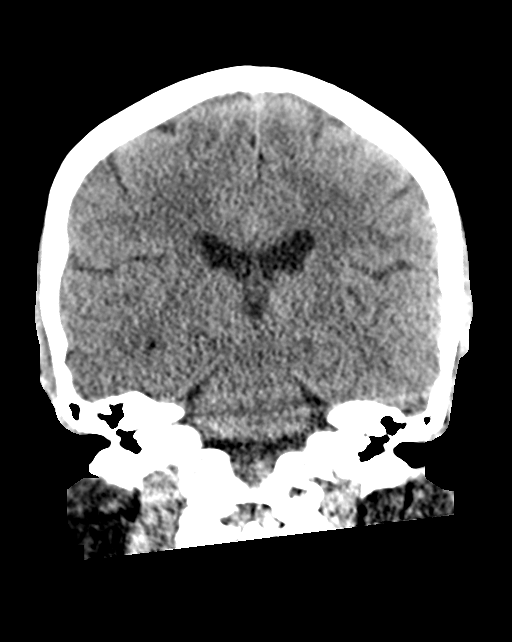

[Series 6: sagittal soft tissue · sagittal · 0.39mm/px · 3 of 51 slices shown]
[im 20/51  brain]
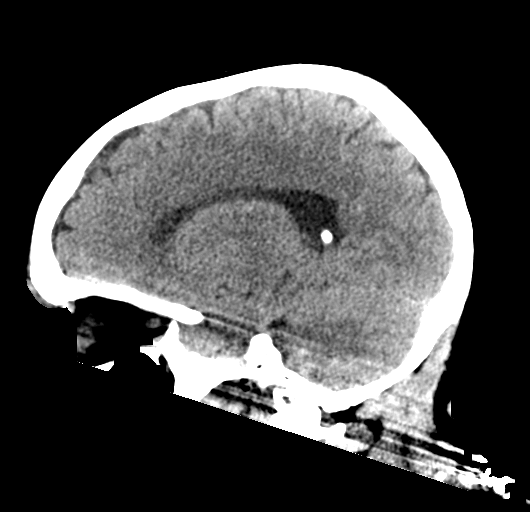
[im 26/51  brain]
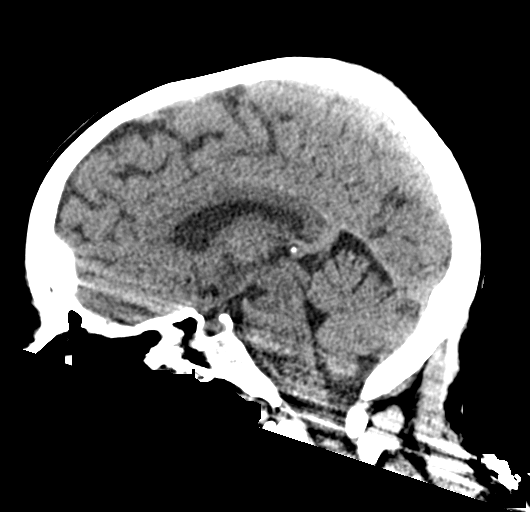
[im 32/51  brain]
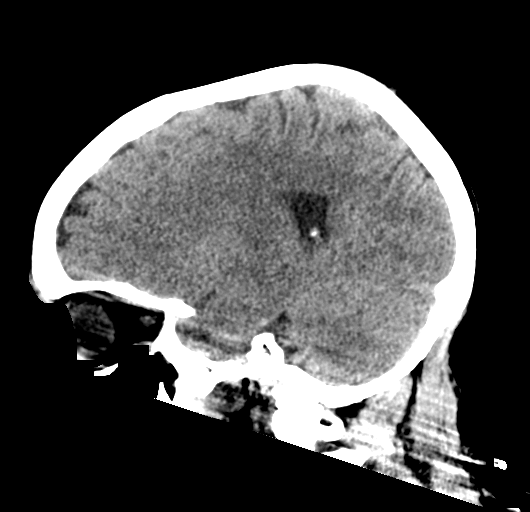

[16 of 47 positions shown; findings below may reference images not displayed]

FINDINGS: Brain: The ventricles and sulci appropriate size for patient's age.
The gray-white matter discrimination is preserved. There is no acute
intracranial hemorrhage. No mass effect or midline shift. No
extra-axial fluid collection.

Vascular: No hyperdense vessel or unexpected calcification.

Skull: Normal. Negative for fracture or focal lesion.

Sinuses/Orbits: There is somewhat dysconjugate gaze. Clinical
correlation is recommended. The visualized paranasal sinuses and
mastoid air cells are clear.

Other: None
IMPRESSION: Unremarkable noncontrast CT of the brain.

## 2023-07-13 DIAGNOSIS — Z124 Encounter for screening for malignant neoplasm of cervix: Secondary | ICD-10-CM | POA: Diagnosis not present

## 2023-07-13 DIAGNOSIS — Z01419 Encounter for gynecological examination (general) (routine) without abnormal findings: Secondary | ICD-10-CM | POA: Diagnosis not present

## 2024-01-11 DIAGNOSIS — R109 Unspecified abdominal pain: Secondary | ICD-10-CM | POA: Diagnosis not present

## 2024-01-11 DIAGNOSIS — R739 Hyperglycemia, unspecified: Secondary | ICD-10-CM | POA: Diagnosis not present

## 2024-01-11 DIAGNOSIS — N1330 Unspecified hydronephrosis: Secondary | ICD-10-CM | POA: Diagnosis not present

## 2024-01-11 DIAGNOSIS — N200 Calculus of kidney: Secondary | ICD-10-CM | POA: Diagnosis not present

## 2024-01-11 DIAGNOSIS — J90 Pleural effusion, not elsewhere classified: Secondary | ICD-10-CM | POA: Diagnosis not present

## 2024-01-11 DIAGNOSIS — D649 Anemia, unspecified: Secondary | ICD-10-CM | POA: Diagnosis not present

## 2024-03-03 DIAGNOSIS — F411 Generalized anxiety disorder: Secondary | ICD-10-CM | POA: Diagnosis not present

## 2024-03-03 DIAGNOSIS — F319 Bipolar disorder, unspecified: Secondary | ICD-10-CM | POA: Diagnosis not present

## 2024-03-03 DIAGNOSIS — Z630 Problems in relationship with spouse or partner: Secondary | ICD-10-CM | POA: Diagnosis not present

## 2024-03-11 DIAGNOSIS — Z630 Problems in relationship with spouse or partner: Secondary | ICD-10-CM | POA: Diagnosis not present

## 2024-03-11 DIAGNOSIS — F319 Bipolar disorder, unspecified: Secondary | ICD-10-CM | POA: Diagnosis not present

## 2024-03-11 DIAGNOSIS — F411 Generalized anxiety disorder: Secondary | ICD-10-CM | POA: Diagnosis not present
